# Patient Record
Sex: Female | Born: 1985 | Race: Black or African American | Hispanic: No | Marital: Single | Smoking: Current every day smoker
Health system: Southern US, Community
[De-identification: ages and names within clinical notes are randomized; demographics above are authoritative.]

---

## 2007-09-20 ENCOUNTER — Emergency Department: Payer: Self-pay | Admitting: Unknown Physician Specialty

## 2008-03-13 ENCOUNTER — Encounter: Payer: Self-pay | Admitting: Obstetrics and Gynecology

## 2008-06-06 ENCOUNTER — Observation Stay: Payer: Self-pay | Admitting: Obstetrics and Gynecology

## 2008-06-07 ENCOUNTER — Inpatient Hospital Stay: Payer: Self-pay

## 2008-10-16 ENCOUNTER — Emergency Department: Payer: Self-pay | Admitting: Emergency Medicine

## 2011-12-30 ENCOUNTER — Ambulatory Visit: Payer: Self-pay | Admitting: Oncology

## 2012-04-05 ENCOUNTER — Inpatient Hospital Stay: Payer: Self-pay

## 2012-04-06 LAB — CBC WITH DIFFERENTIAL/PLATELET
Basophil #: 0.2 10*3/uL — ABNORMAL HIGH (ref 0.0–0.1)
Basophil %: 2.5 %
Eosinophil %: 0.8 %
HCT: 35.8 % (ref 35.0–47.0)
MCH: 28.7 pg (ref 26.0–34.0)
MCHC: 33.9 g/dL (ref 32.0–36.0)
Monocyte #: 0.9 x10 3/mm (ref 0.2–0.9)
Neutrophil #: 6 10*3/uL (ref 1.4–6.5)
Platelet: 148 10*3/uL — ABNORMAL LOW (ref 150–440)
WBC: 8.6 10*3/uL (ref 3.6–11.0)

## 2012-04-07 LAB — HEMATOCRIT: HCT: 28.7 % — ABNORMAL LOW (ref 35.0–47.0)

## 2013-11-02 ENCOUNTER — Emergency Department: Payer: Self-pay | Admitting: Emergency Medicine

## 2014-01-24 ENCOUNTER — Emergency Department: Payer: Self-pay | Admitting: Emergency Medicine

## 2014-04-28 NOTE — Op Note (Signed)
PATIENT NAME:  Rita Ellis, Rita Ellis MR#:  [id number] DATE OF BIRTH:  1985-09-29  DATE OF PROCEDURE:  04/06/2012  NO DICTATION.  ____________________________ Suzy Bouchardhomas J. Schermerhorn, MD tjs:ct D: 04/06/2012 08:27:37 ET T: 04/06/2012 08:59:08 ET JOB#: [id number]  cc: Suzy Bouchardhomas J. Schermerhorn, MD, <Dictator> Suzy BouchardHOMAS J SCHERMERHORN MD ELECTRONICALLY SIGNED 04/12/2012 9:30

## 2014-04-28 NOTE — Op Note (Signed)
PATIENT NAME:  Rita Ellis, Rita Ellis MR#:  [id number] DATE OF BIRTH:  04-12-1985  DATE OF PROCEDURE:  04/06/2012  PREOPERATIVE DIAGNOSIS: Fetal bradycardia, 40 + [redacted] weeks gestation.   POSTOPERATIVE DIAGNOSIS: Fetal bradycardia, 40 + [redacted] weeks gestation.   PROCEDURE: Stat primary low transverse cesarean section.   SURGEON: Rita Ellis, Ellis.D.   FIRST ASSISTANT: Yetta BarreJones.   ANESTHESIA:  General endotracheal.  INDICATIONS: This is a 29 year old gravida 3, para 1 at 40 + 0 weeks. Labored throughout the night. Developed persistent fetal bradycardia to the 80s and 90s for 9 minutes. Stat C-section was called for.   DESCRIPTION OF PROCEDURE:  After the patient was prepped and draped in sterile fashion, general endotracheal anesthetic was administered.  A Pfannenstiel incision was made 2 fingerbreadths above the symphysis pubis. Sharp dissection was used to identify the fascia. The fascia was opened in the midline, peritoneal cavity was opened sharply and a low transverse uterine incision was made. Upon entry into the endometrial cavity minimal fluid noted. An extremely wedged fetal head was then encountered. Nursing hand from the vaginal route allowed for elevation of the head and delivery of the infant female. Dr. Beckie Saltsasnadi received the baby. The baby did have some spontaneous cries on the abdomen and Apgars were assigned as 8 and 8. The placenta was manually delivered. The uterus was exteriorized. The endometrial cavity was wiped clean. The patient at this time did receive 2 grams IV Ancef. This could not be administered prior to the start given the emergent nature of the procedure. The patient did receive 40 units of Pitocin in the IV bag as well. Uterine incision was closed with 1 chromic suture in a running locking fashion with good approximation of edges. Good hemostasis was noted. The fallopian tubes and ovaries appeared normal. The posterior cul-de-sac was irrigated and suctioned. The uterus was placed  back into the abdominal cavity and the uterine incision again appeared hemostatic. Interceed was placed over the lower uterine segment in a T-shaped fashion. The paracolic gutters were wiped clean with laparotomy tape. The fascia was then closed with 0 Vicryl suture in a running nonlocking fashion with good approximation of edges. Subcutaneous tissues were irrigated and bovied for hemostasis. The skin was reapproximated with staples. There were no complications. Cord gas pH 7.32, pCO2 47 and base excess -2.2.  The patient tolerated the procedure well and was taken to the recovery room in good condition.  ____________________________ Rita Bouchardhomas J. Margarie Mcguirt, MD tjs:sb D: 04/06/2012 08:32:02 ET T: 04/06/2012 09:08:56 ET JOB#: [id number]  cc: Rita Bouchardhomas J. Chastin Riesgo, MD, <Dictator> Rita BouchardHOMAS J Jefry Lesinski MD ELECTRONICALLY SIGNED 04/12/2012 9:31

## 2014-05-16 NOTE — H&P (Signed)
L&D Evaluation:  History:  HPI 29yo [id number] with LMP of 06/04/11 & [facility]  +Chlamydia tx and pt vomited so unsure if [facility] re-treated. Will check a Chlamydia. Pt presented this pm with UC's becoming more uncomfortable No ROM,VB or decreased FM. Cx is 4/80/post/vtx but pt is in active labor and will admit. GBS is unknown as no record of GBS on chart.   Presents with contractions   Patient's Medical History Ecclampsia with 1st pregnancy, child autistic, +GC at [facility] with last preg   Patient's Surgical History none   Medications Pre Natal Vitamins   Allergies NKDA   Social History tobacco  quit smoking early pregnancy   Family History Non-Contributory   ROS:  ROS All systems were reviewed.  HEENT, CNS, GI, GU, Respiratory, CV, Renal and Musculoskeletal systems were found to be normal.   Exam:  Vital Signs stable   Reflexes 1+   Mebranes Intact   FHT normal rate with no decels, +accels, no decels, Cat I   Ucx regular, q 3-4 mins, palp as mod   Skin dry   Lymph no lymphadenopathy   Impression:  Impression active labor   Plan:  Plan monitor contractions and for cervical change, antibiotics for GBBS prophylaxis, GBs unknown   Comments Pt is uncomfortable with UC's   Electronic Signatures: Sharee PimpleJones, Madoline Bhatt W (CNM)  (Signed 01-Apr-14 00:10)  Authored: L&D Evaluation   Last Updated: 01-Apr-14 00:10 by Sharee PimpleJones, Chanze Teagle W (CNM)

## 2016-02-17 ENCOUNTER — Emergency Department
Admission: EM | Admit: 2016-02-17 | Discharge: 2016-02-17 | Disposition: A | Discharge status: Home / Self Care | Payer: Medicaid Other | Attending: Emergency Medicine | Admitting: Emergency Medicine

## 2016-02-17 DIAGNOSIS — N390 Urinary tract infection, site not specified: Secondary | ICD-10-CM | POA: Diagnosis not present

## 2016-02-17 DIAGNOSIS — K0889 Other specified disorders of teeth and supporting structures: Secondary | ICD-10-CM | POA: Diagnosis present

## 2016-02-17 DIAGNOSIS — K029 Dental caries, unspecified: Secondary | ICD-10-CM | POA: Insufficient documentation

## 2016-02-17 DIAGNOSIS — N912 Amenorrhea, unspecified: Secondary | ICD-10-CM | POA: Insufficient documentation

## 2016-02-17 LAB — URINALYSIS, COMPLETE (UACMP) WITH MICROSCOPIC
Bilirubin Urine: NEGATIVE
GLUCOSE, UA: NEGATIVE mg/dL
KETONES UR: 5 mg/dL — AB
Nitrite: NEGATIVE
PH: 6 (ref 5.0–8.0)
Protein, ur: 30 mg/dL — AB
Specific Gravity, Urine: 1.024 (ref 1.005–1.030)

## 2016-02-17 LAB — POCT PREGNANCY, URINE: Preg Test, Ur: NEGATIVE

## 2016-02-17 MED ORDER — TRAMADOL HCL 50 MG PO TABS: 50.0000 mg | ORAL_TABLET | Freq: Four times a day (QID) | ORAL | 0 refills | Status: DC | PRN

## 2016-02-17 MED ORDER — CEPHALEXIN 500 MG PO CAPS
500.0000 mg | ORAL_CAPSULE | Freq: Once | ORAL | Status: AC
  Administered 2016-02-17: 500 mg via ORAL
  Filled 2016-02-17: qty 1

## 2016-02-17 MED ORDER — CEPHALEXIN 500 MG PO CAPS: 500.0000 mg | ORAL_CAPSULE | Freq: Four times a day (QID) | ORAL | 0 refills | Status: AC

## 2016-02-17 MED ORDER — LIDOCAINE VISCOUS 2 % MT SOLN
15.0000 mL | Freq: Once | OROMUCOSAL | Status: AC
  Administered 2016-02-17: 15 mL via OROMUCOSAL
  Filled 2016-02-17: qty 15

## 2016-02-17 MED ORDER — IBUPROFEN 800 MG PO TABS
800.0000 mg | ORAL_TABLET | Freq: Once | ORAL | Status: AC
  Administered 2016-02-17: 800 mg via ORAL
  Filled 2016-02-17: qty 1

## 2016-02-17 MED ORDER — IBUPROFEN 800 MG PO TABS: 800.0000 mg | ORAL_TABLET | Freq: Three times a day (TID) | ORAL | 0 refills | Status: DC | PRN

## 2016-02-17 NOTE — ED Notes (Signed)
NAD noted at time of D/C. Pt denies questions or concerns. Pt ambulatory to the lobby at this time.  

## 2016-02-17 NOTE — ED Provider Notes (Signed)
[facility] Emergency Department Provider Note   ____________________________________________   First MD Initiated Contact with Patient 02/17/16 2204     (approximate)  I have reviewed the triage vital signs and the nursing notes.   HISTORY  Chief Complaint Dental Pain and Possible Pregnancy    HPI Rita Ellis is a 31 y.o. female patient complain of right upper jaw pain. Patient has a history of dental pain for 2 months. Patient also states she request she will a dental pregnancy test because she does not know the date of last menstrual period. Patient denies fever, flank pain, or vaginal discharge. Patient states he feel like something is moving in her stomach.  No past medical history on file.  There are no active problems to display for this patient.   No past surgical history on file.  Prior to Admission medications   Medication Sig Start Date End Date Taking? Authorizing Provider  cephALEXin (KEFLEX) 500 MG capsule Take 1 capsule (500 mg total) by mouth 4 (four) times daily. 02/17/16 02/27/16  Joni Reining, PA-C  ibuprofen (ADVIL,MOTRIN) 800 MG tablet Take 1 tablet (800 mg total) by mouth every 8 (eight) hours as needed for moderate pain. 02/17/16   Joni Reining, PA-C  traMADol (ULTRAM) 50 MG tablet Take 1 tablet (50 mg total) by mouth every 6 (six) hours as needed for moderate pain. 02/17/16   Joni Reining, PA-C    Allergies Patient has no known allergies.  No family history on file.  Social History Social History  Substance Use Topics  . Smoking status: Not on file  . Smokeless tobacco: Not on file  . Alcohol use Not on file    Review of Systems Constitutional: No fever/chills Eyes: No visual changes. ENT: No sore throat. Cardiovascular: Denies chest pain. Respiratory: Denies shortness of breath. Gastrointestinal: No abdominal pain.  No nausea, no vomiting.  No diarrhea.  No constipation. Genitourinary: Negative for  dysuria. Musculoskeletal: Negative for back pain. Skin: Negative for rash. Neurological: Negative for headaches, focal weakness or numbness.  .  ____________________________________________   PHYSICAL EXAM:  VITAL SIGNS: ED Triage Vitals  Enc Vitals Group     BP 02/17/16 2123 (!) 127/99     Pulse Rate 02/17/16 2123 80     Resp 02/17/16 2123 20     Temp 02/17/16 2123 98.9 F (37.2 C)     Temp Source 02/17/16 2123 Oral     SpO2 02/17/16 2123 100 %     Weight 02/17/16 2122 200 lb (90.7 kg)     Height 02/17/16 2122 5' (1.524 m)     Head Circumference --      Peak Flow --      Pain Score 02/17/16 2123 8     Pain Loc --      Pain Edu? --      Excl. in GC? --     Constitutional: Alert and oriented. Well appearing and in no acute distress. Eyes: Conjunctivae are normal. PERRL. EOMI. Head: Atraumatic. Nose: No congestion/rhinnorhea. Mouth/Throat: Mucous membranes are moist.  Oropharynx non-erythematous. Neck: No stridor. No cervical spine tenderness to palpation. Hematological/Lymphatic/Immunilogical: No cervical lymphadenopathy. Cardiovascular: Normal rate, regular rhythm. Grossly normal heart sounds.  Good peripheral circulation. Respiratory: Normal respiratory effort.  No retractions. Lungs CTAB. Gastrointestinal: Soft and nontender. No distention. No abdominal bruits. No CVA tenderness. Musculoskeletal: No lower extremity tenderness nor edema.  No joint effusions. Neurologic:  Normal speech and language. No gross focal neurologic  deficits are appreciated. No gait instability. Skin:  Skin is warm, dry and intact. No rash noted. Psychiatric: Mood and affect are normal. Speech and behavior are normal.  ____________________________________________   LABS (all labs ordered are listed, but only abnormal results are displayed)  Labs Reviewed  URINALYSIS, COMPLETE (UACMP) WITH MICROSCOPIC - Abnormal; Notable for the following:       Result Value   Color, Urine YELLOW (*)     APPearance CLOUDY (*)    Hgb urine dipstick MODERATE (*)    Ketones, ur 5 (*)    Protein, ur 30 (*)    Leukocytes, UA SMALL (*)    Bacteria, UA MANY (*)    Squamous Epithelial / LPF 0-5 (*)    All other components within normal limits  POC URINE PREG, ED   ____________________________________________  EKG   ____________________________________________  RADIOLOGY   ____________________________________________   PROCEDURES  Procedure(s) performed: None  Procedures  Critical Care performed: No  ____________________________________________   INITIAL IMPRESSION / ASSESSMENT AND PLAN / ED COURSE  Pertinent labs & imaging results that were available during my care of the patient were reviewed by me and considered in my medical decision making (see chart for details).  Urinary tract infection and dental pain. Patient given discharge care instructions. Patient given nystatin to close follow-up care. Patient started on Keflex, tramadol, ibuprofen, and viscous lidocaine.      ____________________________________________   FINAL CLINICAL IMPRESSION(S) / ED DIAGNOSES  Final diagnoses:  Pain due to dental caries  Amenorrhea  Urinary tract infection without hematuria, site unspecified      NEW MEDICATIONS STARTED DURING THIS VISIT:  New Prescriptions   CEPHALEXIN (KEFLEX) 500 MG CAPSULE    Take 1 capsule (500 mg total) by mouth 4 (four) times daily.   IBUPROFEN (ADVIL,MOTRIN) 800 MG TABLET    Take 1 tablet (800 mg total) by mouth every 8 (eight) hours as needed for moderate pain.   TRAMADOL (ULTRAM) 50 MG TABLET    Take 1 tablet (50 mg total) by mouth every 6 (six) hours as needed for moderate pain.     Note:  This document was prepared using Dragon voice recognition software and may include unintentional dictation errors.    Joni ReiningRonald K Smith, PA-C 02/17/16 [id number]    Emily FilbertJonathan E Williams, MD 02/17/16 [phone]

## 2016-02-17 NOTE — Discharge Instructions (Signed)
OPTIONS FOR DENTAL FOLLOW UP CARE ° °[place] Department of Health and Human Services - Local Safety Net Dental Clinics °http://www.ncdhhs.gov/dph/oralhealth/services/safetynetclinics.htm °  °[facility] (336-562-3123) ° °[facility] [place] ([phone]) ° °[facility] ([phone] ext [phone]) ° °[facility] ([phone]) ° °[facility] ([phone]) °This clinic caters to the indigent population and is on a lottery system. °Location: °[facility], [address], [place] °Clinic Hours: °Wednesdays from 6pm - 9pm, patients seen by a lottery system. °For dates, call or go to www.med.[facility].edu/[facility]/patients/Dental-[facility] °Services: °Cleanings, fillings and simple extractions. °Payment Options: °DENTAL WORK IS FREE OF CHARGE. Bring proof of income or support. °Best way to get seen: °Arrive at 5:15 pm - this is a lottery, NOT first come/first serve, so arriving earlier will not increase your chances of being seen. °  °  °[facility] °[phone] °Select option 1 for emergencies °  °Location: °[facility], [address], [place] °Clinic Hours: °No walk-ins accepted - call the day before to schedule an appointment. °Check in times are 9:30 am and 1:30 pm. °Services: °Simple extractions, temporary fillings, pulpectomy/pulp debridement, uncomplicated abscess drainage. °Payment Options: °PAYMENT IS DUE AT THE TIME OF SERVICE.  Fee is usually $100-200, additional surgical procedures (e.g. abscess drainage) may be extra. °Cash, checks, Visa/MasterCard accepted.  Can file Medicaid if patient is covered for dental - patient should call case worker to check. °No discount for [facility] patients. °Best way to get seen: °MUST call the day before and get onto the schedule. Can usually be seen the next 1-2 days. No walk-ins accepted. °  °  °[facility] °[phone] °   °Location: °[facility], [address], [place] °Clinic Hours: °M, W, Th, F 8am or 1:30pm, Tues 9a or 1:30 - first come/first served. °Services: °Simple extractions, temporary fillings, uncomplicated abscess drainage.  You do not need to be an [place] resident. °Payment Options: °PAYMENT IS DUE AT THE TIME OF SERVICE. °Dental insurance, otherwise sliding scale - bring proof of income or support. °Depending on income and treatment needed, cost is usually $50-200. °Best way to get seen: °Arrive early as it is first come/first served. °  °  °[facility] °[phone] °  °Location: °[address] °Clinic Hours: °Mon-Thu 8a-5p °Services: °Most basic dental services including extractions and fillings. °Payment Options: °PAYMENT IS DUE AT THE TIME OF SERVICE. °Sliding scale, up to 50% off - bring proof if income or support. °Medicaid with dental option accepted. °Best way to get seen: °Call to schedule an appointment, can usually be seen within 2 weeks OR they will try to see walk-ins - show up at 8a or 2p (you may have to wait). °  °  °[facility] °919-245-2435 °[place] RESIDENTS ONLY °  °Location: °[facility], [address], [place], [place] [address] °Clinic Hours: By appointment only. °Monday - Thursday 8am-5pm, Friday 8am-12pm °Services: Cleanings, fillings, extractions. °Payment Options: °PAYMENT IS DUE AT THE TIME OF SERVICE. °Cash, Visa or MasterCard. Sliding scale - $30 minimum per service. °Best way to get seen: °Come in to office, complete packet and make an appointment - need proof of income °or support monies for each household member and proof of [place] residence. °Usually takes about a month to get in. °  °  °[facility] °[phone] °  °Location: °[address]   [place] °Clinic Hours: Walk-in Urgent Care Dental Services are offered Monday-Friday  mornings only. °The numbers of emergencies accepted daily is limited to the number of °providers available. °Maximum 15 - Mondays, Wednesdays & Thursdays °Maximum 10 - Tuesdays & Fridays °Services: °You do not need to be a [place] resident to be seen for a dental emergency. °Emergencies are defined as pain, swelling, abnormal bleeding, or dental trauma. Walkins will receive x-rays if needed. °NOTE: Dental cleaning is not an emergency. °Payment Options: °PAYMENT IS DUE AT THE TIME OF SERVICE. °Minimum co-pay is $40.00 for uninsured patients. °Minimum co-pay is $3.00 for Medicaid with dental coverage. °Dental Insurance is accepted and must be presented at time of visit. °Medicare does not cover dental. °Forms of payment: Cash, credit card, checks. °Best way to get seen: °If not previously registered with the clinic, walk-in dental registration begins at 7:15 am and is on a first come/first serve basis. °If previously registered with the clinic, call to make an appointment. °  °  °[facility] °[phone] °[place] RESIDENTS ONLY °  °Location: °[address], [place], [place] °Clinic Hours: °Mon-Thu 10a-2p °Services: Extractions only! °Payment Options: °FREE (donations accepted) - bring proof of income or support °Best way to get seen: °Call and schedule an appointment OR come at 8am on the 1st Monday of every month (except for holidays) when it is first come/first served. °  °  °[facility] °[phone] °  °Location: °[address], [place] °Clinic Hours: °Friday mornings °Services, Payment Options, Best way to get seen: °Call for info °

## 2016-02-17 NOTE — ED Notes (Signed)
Pt c/o R sided dental pain x 2 months. Pt also requests a pregnancy test at this time. NAD noted, respirations even and unlabored.

## 2016-02-17 NOTE — ED Triage Notes (Signed)
Patient reports having left upper jaw pain.  Patient also states "Can I have a pregnancy test.  I keep feeling something move in my stomach and I don't know when my last period was."  Patient does not states having abdominal pain.

## 2017-04-26 ENCOUNTER — Other Ambulatory Visit: Payer: Self-pay

## 2017-04-26 ENCOUNTER — Emergency Department
Admission: EM | Admit: 2017-04-26 | Discharge: 2017-04-26 | Disposition: A | Discharge status: Home / Self Care | Payer: Medicaid Other | Attending: Emergency Medicine | Admitting: Emergency Medicine

## 2017-04-26 ENCOUNTER — Encounter: Payer: Self-pay | Admitting: Emergency Medicine

## 2017-04-26 DIAGNOSIS — N3090 Cystitis, unspecified without hematuria: Secondary | ICD-10-CM

## 2017-04-26 DIAGNOSIS — N3000 Acute cystitis without hematuria: Secondary | ICD-10-CM | POA: Diagnosis not present

## 2017-04-26 DIAGNOSIS — R300 Dysuria: Secondary | ICD-10-CM | POA: Diagnosis present

## 2017-04-26 LAB — URINALYSIS, COMPLETE (UACMP) WITH MICROSCOPIC
Bilirubin Urine: NEGATIVE
GLUCOSE, UA: NEGATIVE mg/dL
Ketones, ur: 5 mg/dL — AB
Nitrite: POSITIVE — AB
PH: 6 (ref 5.0–8.0)
Protein, ur: 100 mg/dL — AB
SPECIFIC GRAVITY, URINE: 1.027 (ref 1.005–1.030)

## 2017-04-26 MED ORDER — CEFTRIAXONE SODIUM 1 G IJ SOLR
1.0000 g | Freq: Once | INTRAMUSCULAR | Status: AC
  Administered 2017-04-26: 1 g via INTRAMUSCULAR
  Filled 2017-04-26: qty 10

## 2017-04-26 MED ORDER — AZITHROMYCIN 500 MG PO TABS
1000.0000 mg | ORAL_TABLET | Freq: Once | ORAL | Status: AC
  Administered 2017-04-26: 1000 mg via ORAL
  Filled 2017-04-26: qty 2

## 2017-04-26 MED ORDER — CEPHALEXIN 500 MG PO CAPS: 500.0000 mg | ORAL_CAPSULE | Freq: Three times a day (TID) | ORAL | 0 refills | Status: DC

## 2017-04-26 MED ORDER — METRONIDAZOLE 500 MG PO TABS
2000.0000 mg | ORAL_TABLET | Freq: Once | ORAL | Status: AC
  Administered 2017-04-26: 2000 mg via ORAL
  Filled 2017-04-26: qty 4

## 2017-04-26 MED ORDER — CEPHALEXIN 250 MG/5ML PO SUSR: 500.0000 mg | Freq: Three times a day (TID) | ORAL | 0 refills | Status: AC

## 2017-04-26 NOTE — ED Triage Notes (Signed)
Pt c/o burning and frequency, states was dx with UTI but never took abx due to losing them.

## 2017-04-26 NOTE — ED Notes (Signed)
Patient to ED for bladder pain. States she had the onset of pain with urination two days ago. States she has felt chilled as well though has not checked her temperature. Patient is alert and oriented and able to answer questions; states "I just don't feel good." Patient states painful urination as well as urgency. Present with 32 year old daughter who is busy in the room and able to answer questions for her mother as well.

## 2017-04-26 NOTE — ED Provider Notes (Signed)
[facility] Emergency Department Provider Note  ____________________________________________  Time seen: Approximately 6:02 PM  I have reviewed the triage vital signs and the nursing notes.   HISTORY  Chief Complaint Dysuria    HPI Rita Ellis is a 32 y.o. female presents to the emergency department with dysuria and increased urinary frequency for approximately 1 year.  Patient reports that she presented to her primary care office at some point last year and was prescribed antibiotic but lost prescription.  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  Patient does not want a "pelvic exam".  Patient reports that she was recently tested for syphilis and tested negative but has not undergone recent STD testing.  She denies nausea, vomiting or abdominal pain.  No pelvic pain.  She reports no fever or chills.   History reviewed. No pertinent past medical history.  There are no active problems to display for this patient.   Past Surgical History:  Procedure Laterality Date  . CESAREAN SECTION      Prior to Admission medications   Medication Sig Start Date End Date Taking? Authorizing Provider  cephALEXin (KEFLEX) 250 MG/5ML suspension Take 10 mLs (500 mg total) by mouth 3 (three) times daily for 10 days. 04/26/17 05/06/17  Orvil Feil, PA-C  ibuprofen (ADVIL,MOTRIN) 800 MG tablet Take 1 tablet (800 mg total) by mouth every 8 (eight) hours as needed for moderate pain. 02/17/16   Joni Reining, PA-C  traMADol (ULTRAM) 50 MG tablet Take 1 tablet (50 mg total) by mouth every 6 (six) hours as needed for moderate pain. 02/17/16   Joni Reining, PA-C    Allergies Patient has no known allergies.  No family history on file.  Social History Social History   Tobacco Use  . Smoking status: Never Smoker  . Smokeless tobacco: Never Used  Substance Use Topics  . Alcohol use: Not Currently    Frequency: Never  . Drug use: Never      Review of Systems  Constitutional: No fever/chills Eyes: No visual changes. No discharge ENT: No upper respiratory complaints. Cardiovascular: no chest pain. Respiratory: no cough. No SOB. Gastrointestinal: No abdominal pain.  No nausea, no vomiting.  No diarrhea.  No constipation. Genitourinary: Patient has dysuria and increased urinary frequency.  Musculoskeletal: Negative for musculoskeletal pain. Skin: Negative for rash, abrasions, lacerations, ecchymosis. Neurological: Negative for headaches, focal weakness or numbness.  ____________________________________________   PHYSICAL EXAM:  VITAL SIGNS: ED Triage Vitals [04/26/17 1510]  Enc Vitals Group     BP 129/80     Pulse Rate 83     Resp 18     Temp 98.9 F (37.2 C)     Temp Source Oral     SpO2 100 %     Weight 200 lb (90.7 kg)     Height 5\' 2"  (1.575 m)     Head Circumference      Peak Flow      Pain Score 8     Pain Loc      Pain Edu?      Excl. in GC?      Constitutional: Alert and oriented. Well appearing and in no acute distress. Eyes: Conjunctivae are normal. PERRL. EOMI. Head: Atraumatic. Cardiovascular: Normal rate, regular rhythm. Normal S1 and S2.  Good peripheral circulation. Respiratory: Normal respiratory effort without tachypnea or retractions. Lungs CTAB. Good air entry to the bases with no decreased or absent breath sounds. Gastrointestinal: Bowel  sounds 4 quadrants. Soft and nontender to palpation. No guarding or rigidity. No palpable masses. No distention. No CVA tenderness. Musculoskeletal: Full range of motion to all extremities. No gross deformities appreciated. Neurologic:  Normal speech and language. No gross focal neurologic deficits are appreciated.  Skin:  Skin is warm, dry and intact. No rash noted.  ____________________________________________   LABS (all labs ordered are listed, but only abnormal results are displayed)  Labs Reviewed  URINALYSIS, COMPLETE (UACMP) WITH  MICROSCOPIC - Abnormal; Notable for the following components:      Result Value   Color, Urine YELLOW (*)    APPearance CLOUDY (*)    Hgb urine dipstick LARGE (*)    Ketones, ur 5 (*)    Protein, ur 100 (*)    Nitrite POSITIVE (*)    Leukocytes, UA LARGE (*)    Bacteria, UA FEW (*)    Squamous Epithelial / LPF 0-5 (*)    All other components within normal limits   ____________________________________________  EKG   ____________________________________________  RADIOLOGY  No results found.  ____________________________________________    PROCEDURES  Procedure(s) performed:    Procedures    Medications  cefTRIAXone (ROCEPHIN) injection 1 g (1 g Intramuscular Given 04/26/17 1825)  azithromycin (ZITHROMAX) tablet 1,000 mg (1,000 mg Oral Given 04/26/17 1824)  metroNIDAZOLE (FLAGYL) tablet 2,000 mg (2,000 mg Oral Given 04/26/17 1824)     ____________________________________________   INITIAL IMPRESSION / ASSESSMENT AND PLAN / ED COURSE  Pertinent labs & imaging results that were available during my care of the patient were reviewed by me and considered in my medical decision making (see chart for details).  Review of the [place] CSRS was performed in accordance of the [facility] prior to dispensing any controlled drugs.     Assessment and Plan: Acute Cystitis:  Patient presents to the emergency department with dysuria and increased urinary frequency for a reported year duration.  Patient declined.  I offered to treat patient empirically for gonorrhea, chlamydia testing for gonorrhea, chlamydia and trichomoniasis despite my medical advice and trichomoniasis and she accepted treatment.  Patient received Rocephin, azithromycin and Flagyl in the emergency department.  She was discharged with Keflex and advised to follow-up with primary care for additional STD testing.  Patient voiced understanding.  All patient questions were  answered.     ____________________________________________  FINAL CLINICAL IMPRESSION(S) / ED DIAGNOSES  Final diagnoses:  Cystitis      NEW MEDICATIONS STARTED DURING THIS VISIT:  ED Discharge Orders        Ordered    cephALEXin (KEFLEX) 500 MG capsule  3 times daily,   Status:  Discontinued     04/26/17 1845    cephALEXin (KEFLEX) 250 MG/5ML suspension  3 times daily     04/26/17 1903          This chart was dictated using voice recognition software/Dragon. Despite best efforts to proofread, errors can occur which can change the meaning. Any change was purely unintentional.    Gasper LloydWoods, Jaclyn M, PA-C 04/26/17 [place] [id number]    Stafford, Phillip, MD 04/26/17 [phone]

## 2017-12-08 ENCOUNTER — Encounter: Payer: Self-pay | Admitting: Emergency Medicine

## 2017-12-08 ENCOUNTER — Other Ambulatory Visit: Payer: Self-pay

## 2017-12-08 ENCOUNTER — Emergency Department
Admission: EM | Admit: 2017-12-08 | Discharge: 2017-12-08 | Disposition: A | Discharge status: Home / Self Care | Payer: Medicaid Other | Attending: Emergency Medicine | Admitting: Emergency Medicine

## 2017-12-08 DIAGNOSIS — R1010 Upper abdominal pain, unspecified: Secondary | ICD-10-CM | POA: Diagnosis present

## 2017-12-08 DIAGNOSIS — K219 Gastro-esophageal reflux disease without esophagitis: Secondary | ICD-10-CM

## 2017-12-08 DIAGNOSIS — K2900 Acute gastritis without bleeding: Secondary | ICD-10-CM | POA: Diagnosis not present

## 2017-12-08 LAB — URINALYSIS, COMPLETE (UACMP) WITH MICROSCOPIC
Bilirubin Urine: NEGATIVE
GLUCOSE, UA: NEGATIVE mg/dL
HGB URINE DIPSTICK: NEGATIVE
Ketones, ur: NEGATIVE mg/dL
NITRITE: NEGATIVE
PH: 5 (ref 5.0–8.0)
PROTEIN: NEGATIVE mg/dL
SPECIFIC GRAVITY, URINE: 1.028 (ref 1.005–1.030)

## 2017-12-08 LAB — POC URINE PREG, ED: PREG TEST UR: NEGATIVE

## 2017-12-08 MED ORDER — FAMOTIDINE 20 MG PO TABS: 20.0000 mg | ORAL_TABLET | Freq: Two times a day (BID) | ORAL | 0 refills | Status: DC

## 2017-12-08 MED ORDER — ALUM & MAG HYDROXIDE-SIMETH 200-200-20 MG/5ML PO SUSP
30.0000 mL | Freq: Once | ORAL | Status: AC
  Administered 2017-12-08: 30 mL via ORAL
  Filled 2017-12-08: qty 30

## 2017-12-08 NOTE — ED Provider Notes (Signed)
[facility] Emergency Department Provider Note  ___________________________________________   First MD Initiated Contact with Patient 12/08/17 1935     (approximate)  I have reviewed the triage vital signs and the nursing notes.   HISTORY  Chief Complaint Abdominal Pain   HPI JAYCE BOYKO is a 32 y.o. female with 2 days of upper abdominal pain that she says worsens when she lays flat.  Says that she has had a history of reflux but only when she has been pregnant.  Is not tried any medication for the pain.  Is refusing blood work at this time because she does not want needles.  Also with recent watery stools 1 day ago but this is stopped as of today.  Patient able to tolerate food and ate a hotdog for lunch today which says slightly worsened the pain.  Denies any chest pain or shortness of breath.  Patient is denying any lower abdominal pain.   History reviewed. No pertinent past medical history.  There are no active problems to display for this patient.   Past Surgical History:  Procedure Laterality Date  . CESAREAN SECTION      Prior to Admission medications   Medication Sig Start Date End Date Taking? Authorizing Provider  ibuprofen (ADVIL,MOTRIN) 800 MG tablet Take 1 tablet (800 mg total) by mouth every 8 (eight) hours as needed for moderate pain. 02/17/16   Joni Reining, PA-C  traMADol (ULTRAM) 50 MG tablet Take 1 tablet (50 mg total) by mouth every 6 (six) hours as needed for moderate pain. 02/17/16   Joni Reining, PA-C    Allergies Patient has no known allergies.  No family history on file.  Social History Social History   Tobacco Use  . Smoking status: Never Smoker  . Smokeless tobacco: Never Used  Substance Use Topics  . Alcohol use: Not Currently    Frequency: Never  . Drug use: Never    Review of Systems  Constitutional: No fever/chills Eyes: No visual changes. ENT: No sore throat. Cardiovascular: Denies chest  pain. Respiratory: Denies shortness of breath. Gastrointestinal:  No constipation. Genitourinary: Negative for dysuria. Musculoskeletal: Negative for back pain. Skin: Negative for rash. Neurological: Negative for headaches, focal weakness or numbness.   ____________________________________________   PHYSICAL EXAM:  VITAL SIGNS: ED Triage Vitals  Enc Vitals Group     BP 12/08/17 1929 111/64     Pulse Rate 12/08/17 1929 64     Resp 12/08/17 1929 19     Temp 12/08/17 1929 98.4 F (36.9 C)     Temp Source 12/08/17 1929 Oral     SpO2 12/08/17 1929 100 %     Weight --      Height 12/08/17 1929 5' (1.524 m)     Head Circumference --      Peak Flow --      Pain Score 12/08/17 1933 6     Pain Loc --      Pain Edu? --      Excl. in GC? --     Constitutional: Alert and oriented. Well appearing and in no acute distress. Eyes: Conjunctivae are normal.  Head: Atraumatic. Nose: No congestion/rhinnorhea. Mouth/Throat: Mucous membranes are moist.  Neck: No stridor.   Cardiovascular: Normal rate, regular rhythm. Grossly normal heart sounds.   Respiratory: Normal respiratory effort.  No retractions. Lungs CTAB. Gastrointestinal: Soft with minimal epigastric tenderness to palpation.  No rebound or guarding. No distention. No CVA tenderness. Musculoskeletal: No lower  extremity tenderness nor edema.  No joint effusions. Neurologic:  Normal speech and language. No gross focal neurologic deficits are appreciated. Skin:  Skin is warm, dry and intact. No rash noted. Psychiatric: Mood and affect are normal. Speech and behavior are normal.  ____________________________________________   LABS (all labs ordered are listed, but only abnormal results are displayed)  Labs Reviewed  URINALYSIS, COMPLETE (UACMP) WITH MICROSCOPIC - Abnormal; Notable for the following components:      Result Value   Color, Urine YELLOW (*)    APPearance HAZY (*)    Leukocytes, UA TRACE (*)    Bacteria, UA RARE  (*)    All other components within normal limits  URINE CULTURE  POC URINE PREG, ED   ____________________________________________  EKG  ED ECG REPORT I, Arelia Longestavid M Schaevitz, the attending physician, personally viewed and interpreted this ECG.   Date: 12/08/2017  EKG Time: 2041  Rate: 60  Rhythm: normal sinus rhythm  Axis: Normal  Intervals:none  ST&T Change: No ST segment elevation or depression.  No abnormal T wave inversion.  ____________________________________________  RADIOLOGY   ____________________________________________   PROCEDURES  Procedure(s) performed:   Procedures  Critical Care performed:   ____________________________________________   INITIAL IMPRESSION / ASSESSMENT AND PLAN / ED COURSE  Pertinent labs & imaging results that were available during my care of the patient were reviewed by me and considered in my medical decision making (see chart for details).  Differential diagnosis includes, but is not limited to, biliary disease (biliary colic, acute cholecystitis, cholangitis, choledocholithiasis, etc), intrathoracic causes for epigastric abdominal pain including ACS, gastritis, duodenitis, pancreatitis, small bowel or large bowel obstruction, abdominal aortic aneurysm, hernia, and ulcer(s). As part of my medical decision making, I reviewed the following data within the electronic MEDICAL RECORD NUMBER Notes from prior ED visits  ----------------------------------------- 10:13 PM on 12/08/2017 -----------------------------------------  Patient with pain gone after Maalox.  Pregnancy is negative.  Urine contaminated and without urinary symptoms.  Will send for culture but I do not believe the patient to have a urinary tract infection.  Patient will be discharged with Pepcid for what appears to be gastritis/reflux.  She will follow-up in the office with primary care.  She is understanding of the diagnosis as well as treatment and willing to  comply. ____________________________________________   FINAL CLINICAL IMPRESSION(S) / ED DIAGNOSES  Gastritis GERD    NEW MEDICATIONS STARTED DURING THIS VISIT:  New Prescriptions   No medications on file     Note:  This document was prepared using Dragon voice recognition software and may include unintentional dictation errors.     Myrna BlazerSchaevitz, David Matthew, MD 12/08/17 2214

## 2017-12-08 NOTE — ED Notes (Signed)
ED Provider at bedside. 

## 2017-12-08 NOTE — ED Triage Notes (Signed)
Patient ambulatory to triage with steady gait, without difficulty or distress noted; pt reports mid upper abd pain x 2 days; st "I thought I was just hungry"; recent watery stool, V x 1

## 2017-12-08 NOTE — ED Notes (Signed)
Pt refusing blood work at this time. Pt requesting to talk to doctor before testing begins.

## 2017-12-10 LAB — URINE CULTURE

## 2018-10-04 ENCOUNTER — Encounter: Payer: Self-pay | Admitting: *Deleted

## 2018-10-04 ENCOUNTER — Emergency Department: Payer: Medicaid Other

## 2018-10-04 ENCOUNTER — Emergency Department
Admission: EM | Admit: 2018-10-04 | Discharge: 2018-10-04 | Disposition: A | Discharge status: Home / Self Care | Payer: Medicaid Other | Attending: Emergency Medicine | Admitting: Emergency Medicine

## 2018-10-04 ENCOUNTER — Other Ambulatory Visit: Payer: Self-pay

## 2018-10-04 DIAGNOSIS — J020 Streptococcal pharyngitis: Secondary | ICD-10-CM | POA: Diagnosis not present

## 2018-10-04 DIAGNOSIS — J029 Acute pharyngitis, unspecified: Secondary | ICD-10-CM | POA: Diagnosis present

## 2018-10-04 LAB — CBC
HCT: 41.7 % (ref 36.0–46.0)
Hemoglobin: 13.8 g/dL (ref 12.0–15.0)
MCH: 27.9 pg (ref 26.0–34.0)
MCHC: 33.1 g/dL (ref 30.0–36.0)
MCV: 84.2 fL (ref 80.0–100.0)
Platelets: 203 10*3/uL (ref 150–400)
RBC: 4.95 MIL/uL (ref 3.87–5.11)
RDW: 12.8 % (ref 11.5–15.5)
WBC: 10.4 10*3/uL (ref 4.0–10.5)
nRBC: 0 % (ref 0.0–0.2)

## 2018-10-04 LAB — PREGNANCY, URINE: Preg Test, Ur: NEGATIVE

## 2018-10-04 LAB — BASIC METABOLIC PANEL
Anion gap: 12 (ref 5–15)
BUN: 10 mg/dL (ref 6–20)
CO2: 23 mmol/L (ref 22–32)
Calcium: 9.3 mg/dL (ref 8.9–10.3)
Chloride: 97 mmol/L — ABNORMAL LOW (ref 98–111)
Creatinine, Ser: 0.93 mg/dL (ref 0.44–1.00)
GFR calc Af Amer: >60 mL/min (ref >60)
GFR calc non Af Amer: >60 mL/min (ref >60)
Glucose, Bld: 125 mg/dL — ABNORMAL HIGH (ref 70–99)
Potassium: 3.4 mmol/L — ABNORMAL LOW (ref 3.5–5.1)
Sodium: 132 mmol/L — ABNORMAL LOW (ref 135–145)

## 2018-10-04 LAB — GROUP A STREP BY PCR: Group A Strep by PCR: DETECTED — AB

## 2018-10-04 LAB — TROPONIN I (HIGH SENSITIVITY): Troponin I (High Sensitivity): 3 ng/L (ref <18)

## 2018-10-04 MED ORDER — ACETAMINOPHEN 160 MG/5ML PO SOLN
15.0000 mg/kg | Freq: Once | ORAL | Status: AC
  Administered 2018-10-04: 1000 mg via ORAL

## 2018-10-04 MED ORDER — ACETAMINOPHEN 160 MG/5ML PO SUSP
ORAL | Status: AC
  Administered 2018-10-04: 20:00:00
  Filled 2018-10-04: qty 5

## 2018-10-04 MED ORDER — DEXAMETHASONE SODIUM PHOSPHATE 10 MG/ML IJ SOLN
10.0000 mg | Freq: Once | INTRAMUSCULAR | Status: AC
  Administered 2018-10-04: 23:00:00 10 mg via INTRAMUSCULAR
  Filled 2018-10-04: qty 1

## 2018-10-04 MED ORDER — SODIUM CHLORIDE 0.9% FLUSH: 3.0000 mL | Freq: Once | INTRAVENOUS | Status: DC

## 2018-10-04 MED ORDER — PENICILLIN V POTASSIUM 500 MG PO TABS: 500.0000 mg | ORAL_TABLET | Freq: Three times a day (TID) | ORAL | 0 refills | Status: AC

## 2018-10-04 MED ORDER — ACETAMINOPHEN 160 MG/5ML PO SUSP
ORAL | Status: AC
  Filled 2018-10-04: qty 5

## 2018-10-04 NOTE — ED Notes (Signed)
Signature pad not working properly. This RN explained pt DC documents; follow up appointments and Rx. Pt verbalizes understanding of DC documents. Pt able to speak clear at this time asking questions regarding appointment; this RN address all questions.

## 2018-10-04 NOTE — ED Notes (Signed)
This Rn contacted lab for blood draw. Per lab tech pt refused tourniqued to be placed because "it's too tight" Lab tech unable to draw blood. This RN  Will attempt to obtain help.

## 2018-10-04 NOTE — ED Notes (Signed)
Pt nonverbal during this RN attempting to assess pt. This RN asked if it is because she has a sore throath that she is unable to answer question to this RN, pt nods head st yes.

## 2018-10-04 NOTE — Discharge Instructions (Addendum)
Please seek medical attention for any high fevers, chest pain, shortness of breath, change in behavior, persistent vomiting, bloody stool or any other new or concerning symptoms.  

## 2018-10-04 NOTE — ED Triage Notes (Signed)
t to triage via wheelchair.  Pt has fever, sore throat, back pain and sob/cough.  Sx x 2 days.   Pt alert

## 2018-10-04 NOTE — ED Notes (Signed)
This Rn spoke with Rita Ellis to provide an update.

## 2018-10-04 NOTE — ED Notes (Signed)
[facility] EDT will attempt to collect blood sample.

## 2018-10-04 NOTE — ED Provider Notes (Signed)
[facility] Emergency Department Provider Note   ____________________________________________   I have reviewed the triage vital signs and the nursing notes.   HISTORY  Chief Complaint Sore thoat  History limited by: Not Limited   HPI Rita Ellis is a 33 y.o. female who presents to the emergency department today because of concerns for sore throat.  She states it started 2 days ago.  Located on the left side of the throat.  She states it severe.  It is worse when she tries to swallow.  She does feel like she is having difficulty with swallowing and passing food down her throat.  The patient denies any known sick contacts.  The patient is also complaining of some shortness of breath.  She states this is been present for months.  She denies having seen anyone for.  She is also complaining of back pain. She did not sense that she had a fever.  Records reviewed. Per medical record review patient has a history of ER visit for cystitis and gastritis  No past medical history on file.  There are no active problems to display for this patient.   Past Surgical History:  Procedure Laterality Date  . CESAREAN SECTION      Prior to Admission medications   Medication Sig Start Date End Date Taking? Authorizing Provider  famotidine (PEPCID) 20 MG tablet Take 1 tablet (20 mg total) by mouth 2 (two) times daily. 12/08/17 12/08/18  Myrna Blazer, MD  ibuprofen (ADVIL,MOTRIN) 800 MG tablet Take 1 tablet (800 mg total) by mouth every 8 (eight) hours as needed for moderate pain. 02/17/16   Joni Reining, PA-C  traMADol (ULTRAM) 50 MG tablet Take 1 tablet (50 mg total) by mouth every 6 (six) hours as needed for moderate pain. 02/17/16   Joni Reining, PA-C    Allergies Patient has no known allergies.  No family history on file.  Social History Social History   Tobacco Use  . Smoking status: Never Smoker  . Smokeless tobacco: Never Used  Substance Use  Topics  . Alcohol use: Not Currently    Frequency: Never  . Drug use: Never    Review of Systems Constitutional: No fever/chills Eyes: No visual changes. ENT: Positive for sore throat.  Cardiovascular: Denies chest pain. Respiratory: Positive for shortness of breath. Gastrointestinal: No abdominal pain.  No nausea, no vomiting.  No diarrhea.   Genitourinary: Negative for dysuria. Musculoskeletal: Positive for back pain. Skin: Negative for rash. Neurological: Negative for headaches, focal weakness or numbness.  ____________________________________________   PHYSICAL EXAM:  VITAL SIGNS: ED Triage Vitals  Enc Vitals Group     BP 10/04/18 1809 (!) 144/90     Pulse Rate 10/04/18 1809 (!) 124     Resp 10/04/18 1809 (!) 22     Temp 10/04/18 1809 (!) 103.2 F (39.6 C)     Temp Source 10/04/18 1809 Oral     SpO2 10/04/18 1809 99 %     Weight --      Height --      Head Circumference --      Peak Flow --      Pain Score 10/04/18 1810 8   Constitutional: Alert and oriented.  Eyes: Conjunctivae are normal.  ENT      Head: Normocephalic and atraumatic.      Nose: No congestion/rhinnorhea.      Mouth/Throat: Mucous membranes are moist. Unable to visualize tonsils or uvula.  Neck: No stridor. Tenderness to palpation of the left neck.  Hematological/Lymphatic/Immunilogical: No cervical lymphadenopathy. Cardiovascular: Normal rate, regular rhythm.  No murmurs, rubs, or gallops. Respiratory: Normal respiratory effort without tachypnea nor retractions. Breath sounds are clear and equal bilaterally. No wheezes/rales/rhonchi. Gastrointestinal: Soft and non tender. No rebound. No guarding.  Genitourinary: Deferred Musculoskeletal: Normal range of motion in all extremities. No lower extremity edema. Neurologic:  Normal speech and language. No gross focal neurologic deficits are appreciated.  Skin:  Skin is warm, dry and intact. No rash noted. Psychiatric: Mood and affect are  normal. Speech and behavior are normal. Patient exhibits appropriate insight and judgment.  ____________________________________________    LABS (pertinent positives/negatives)  CBC wbc 10.4, hgb 13.8, plt 203 Upreg neg Strep detected Troponin hs 3 BMP na 132, k 3.4, glu 125, cr 0.93 ____________________________________________   EKG  I, Nance Pear, attending physician, personally viewed and interpreted this EKG  EKG Time: 1814 Rate: 124 Rhythm: sinus tachycardia Axis: left axis deviation Intervals: qtc 410 QRS: narrow ST changes: no st elevation Impression: abnormal ekg  ____________________________________________    RADIOLOGY  CXR No acute abnormality  ____________________________________________   PROCEDURES  Procedures  ____________________________________________   INITIAL IMPRESSION / ASSESSMENT AND PLAN / ED COURSE  Pertinent labs & imaging results that were available during my care of the patient were reviewed by me and considered in my medical decision making (see chart for details).   Patient presented to the emergency department today because of concerns for sore throat.  Patient stated significant pain with some difficulty with swallowing.  On exam I cannot visualize the tonsils or the uvula.  Strep was detected.  I discussed with the patient that I cannot rule out deep tissue infection given that I could not fully evaluate her posterior pharynx.  I did recommend CT scan to further evaluate however patient declined.  Discussed risk of deep tissue infection including death.  Will start patient on antibiotics.  ____________________________________________   FINAL CLINICAL IMPRESSION(S) / ED DIAGNOSES  Final diagnoses:  Strep throat     Note: This dictation was prepared with Dragon dictation. Any transcriptional errors that result from this process are unintentional     Nance Pear, MD 10/04/18 [phone]

## 2018-10-04 NOTE — ED Notes (Signed)
Lab called to obtain blood

## 2018-10-04 NOTE — ED Notes (Signed)
ED Provider Goodman at bedside. 

## 2018-10-04 NOTE — ED Notes (Signed)
This Rn spoke with Hughie Closs to provide a pt status update.

## 2018-12-10 ENCOUNTER — Other Ambulatory Visit: Payer: Self-pay

## 2018-12-10 ENCOUNTER — Emergency Department
Admission: EM | Admit: 2018-12-10 | Discharge: 2018-12-11 | Disposition: A | Discharge status: Home / Self Care | Payer: Medicaid Other | Attending: Emergency Medicine | Admitting: Emergency Medicine

## 2018-12-10 DIAGNOSIS — F209 Schizophrenia, unspecified: Secondary | ICD-10-CM

## 2018-12-10 DIAGNOSIS — Z046 Encounter for general psychiatric examination, requested by authority: Secondary | ICD-10-CM | POA: Insufficient documentation

## 2018-12-10 DIAGNOSIS — Z20828 Contact with and (suspected) exposure to other viral communicable diseases: Secondary | ICD-10-CM | POA: Insufficient documentation

## 2018-12-10 DIAGNOSIS — F23 Brief psychotic disorder: Secondary | ICD-10-CM | POA: Diagnosis not present

## 2018-12-10 DIAGNOSIS — F259 Schizoaffective disorder, unspecified: Secondary | ICD-10-CM | POA: Insufficient documentation

## 2018-12-10 LAB — COMPREHENSIVE METABOLIC PANEL
ALT: 28 U/L (ref 0–44)
AST: 28 U/L (ref 15–41)
Albumin: 4.7 g/dL (ref 3.5–5.0)
Alkaline Phosphatase: 41 U/L (ref 38–126)
Anion gap: 12 (ref 5–15)
BUN: 10 mg/dL (ref 6–20)
CO2: 26 mmol/L (ref 22–32)
Calcium: 9.3 mg/dL (ref 8.9–10.3)
Chloride: 99 mmol/L (ref 98–111)
Creatinine, Ser: 0.91 mg/dL (ref 0.44–1.00)
GFR calc Af Amer: >60 mL/min (ref >60)
GFR calc non Af Amer: >60 mL/min (ref >60)
Glucose, Bld: 130 mg/dL — ABNORMAL HIGH (ref 70–99)
Potassium: 3.2 mmol/L — ABNORMAL LOW (ref 3.5–5.1)
Sodium: 137 mmol/L (ref 135–145)
Total Bilirubin: 0.6 mg/dL (ref 0.3–1.2)
Total Protein: 8.5 g/dL — ABNORMAL HIGH (ref 6.5–8.1)

## 2018-12-10 LAB — CBC
HCT: 37.7 % (ref 36.0–46.0)
Hemoglobin: 12.9 g/dL (ref 12.0–15.0)
MCH: 28 pg (ref 26.0–34.0)
MCHC: 34.2 g/dL (ref 30.0–36.0)
MCV: 81.8 fL (ref 80.0–100.0)
Platelets: 242 10*3/uL (ref 150–400)
RBC: 4.61 MIL/uL (ref 3.87–5.11)
RDW: 12.8 % (ref 11.5–15.5)
WBC: 6.9 10*3/uL (ref 4.0–10.5)
nRBC: 0 % (ref 0.0–0.2)

## 2018-12-10 LAB — SALICYLATE LEVEL: Salicylate Lvl: <7 mg/dL (ref 2.8–30.0)

## 2018-12-10 LAB — URINE DRUG SCREEN, QUALITATIVE (ARMC ONLY)
Amphetamines, Ur Screen: NOT DETECTED
Barbiturates, Ur Screen: NOT DETECTED
Benzodiazepine, Ur Scrn: NOT DETECTED
Cannabinoid 50 Ng, Ur ~~LOC~~: NOT DETECTED
Cocaine Metabolite,Ur ~~LOC~~: NOT DETECTED
MDMA (Ecstasy)Ur Screen: NOT DETECTED
Methadone Scn, Ur: NOT DETECTED
Opiate, Ur Screen: NOT DETECTED
Phencyclidine (PCP) Ur S: NOT DETECTED
Tricyclic, Ur Screen: NOT DETECTED

## 2018-12-10 LAB — PREGNANCY, URINE: Preg Test, Ur: NEGATIVE

## 2018-12-10 LAB — ETHANOL: Alcohol, Ethyl (B): <10 mg/dL (ref <10)

## 2018-12-10 LAB — ACETAMINOPHEN LEVEL: Acetaminophen (Tylenol), Serum: <10 ug/mL — ABNORMAL LOW (ref 10–30)

## 2018-12-10 MED ORDER — POTASSIUM CHLORIDE CRYS ER 20 MEQ PO TBCR: 40.0000 meq | EXTENDED_RELEASE_TABLET | Freq: Once | ORAL | Status: DC

## 2018-12-10 NOTE — ED Notes (Signed)
Waiting for psych to see pt before swabbing. Psych aware of pt and going to try to come see pt.

## 2018-12-10 NOTE — ED Notes (Signed)
TTS has spoken with the pts nurse, Pt is uncooperative. Pt will be assessed when she is willing to engage with this Probation officer.

## 2018-12-10 NOTE — ED Notes (Signed)
This RN spoke with Amie Critchley from [employer] who brought pt in. Per Barnabas Lister, BP was called out because pt family had concerns about her mental health, family stated that pt had not been acting right for the past few days. Pt does have mental health hx is and is on medication. Barnabas Lister states that when he arrived at pts home pt was jumping up and down and twitching. Barnabas Lister also states that pt has been talking about things that are not there as if she may be having some hallucinations. Pt family reports that pt has been crying uncontrollably for the past few days as well.  Barnabas Lister reports taking pt through the drive thur to get something to eat on the way to the hospital, Barnabas Lister states that pts demeanor seemed to change after taking her to get food, pt seemed more happy, however, pt did not really eat much of the food. Barnabas Lister states that how pt is acting while he was here with her at the hospital is not the same as she was acting at home prior to coming in.

## 2018-12-10 NOTE — ED Notes (Signed)
Pt states that she feels that she is in a daze. She just doesn't feel right.

## 2018-12-10 NOTE — Consult Note (Signed)
[facility] Face-to-Face Psychiatry Consult   Reason for Consult:  Psych evaluation Referring Physician:  Dr. Jacqualine Code Patient Identification: Rita Ellis MRN:  [id number] Principal Diagnosis: Acute psychosis [facility]) Diagnosis:  Principal Problem:   Acute psychosis ([place]) Active Problems:   Schizoaffective disorder ([place])   Total Time spent with patient: 45 minutes  Subjective:   Rita Ellis is a 33 y.o. female patient admitted with bizarre behavior.     HPI:  Rita Ellis, 33 y.o., female patient seen face to face  by this provider; chart reviewed and consulted with Dr. Jacqualine Code  on 12/10/18.  On evaluation Rita Ellis reports that she is here because she is tired.  When asked her to elaborate, for instance, tired of what? Writer was met with a blank stare. When writer was asking orientation questions, pt would just look blankly and not answer. Pt finally provided her birthday, however, it took a few moments for for her to say it.  Writer ask again, what brings her here and pt responds by shrugging her shoulders.  Pt says that she hasnt taken her meds in a while.  When asked what meds she referring to, she states ibuprofen for her teeth.  Pt denies psych background, psych meds, etc.  Pt is a poor historian.     Per Rn Note on intake:   This RN spoke with Amie Critchley from [facility] who brought pt in. Per Barnabas Lister, BP was called out because pt family had concerns about her mental health, family stated that pt had not been acting right for the past few days. Pt does have mental health hx is and is on medication. Barnabas Lister states that when he arrived at pts home pt was jumping up and down and twitching. Barnabas Lister also states that pt has been talking about things that are not there as if she may be having some hallucinations. Pt family reports that pt has been crying uncontrollably for the past few days as well.  Barnabas Lister reports taking pt through the drive thur to get something to eat on the way to the hospital, Barnabas Lister states that  pts demeanor seemed to change after taking her to get food, pt seemed more happy, however, pt did not really eat much of the food. Barnabas Lister states that how pt is acting while he was here with her at the hospital is not the same as she was acting at home prior to coming in.           During evaluation Rita Ellis is sitting on her bed, she is alert/oriented to self only; her mood is bizzarre  with a flat affect.  Patient is speaking in a low tone at moderate volume, and slow pace; pt appears to be thought blocking, with poor eye contact.  Her thought process is irrelevant and blocked; There is  indication that she is currently responding to internal stimuli and/or experiencing delusional thought content.  Patient denies suicidal/self-harm/homicidal ideation.  At this time, writer does not believe patient can care for herself due to her current mental state.    Past Psychiatric History: yes. History of schizophrenia   Risk to Self:   Risk to Others:   Prior Inpatient Therapy:   Prior Outpatient Therapy:    Past Medical History: History reviewed. No pertinent past medical history.  Past Surgical History:  Procedure Laterality Date  . CESAREAN SECTION     Family History: History reviewed. No pertinent family history. Family Psychiatric  History: unknown Social  History:  Social History   Substance and Sexual Activity  Alcohol Use Not Currently  . Frequency: Never     Social History   Substance and Sexual Activity  Drug Use Never    Social History   Socioeconomic History  . Marital status: Single    Spouse name: Not on file  . Number of children: Not on file  . Years of education: Not on file  . Highest education level: Not on file  Occupational History  . Not on file  Social Needs  . Financial resource strain: Not on file  . Food insecurity    Worry: Not on file    Inability: Not on file  . Transportation needs    Medical: Not on file    Non-medical: Not on file  Tobacco  Use  . Smoking status: Never Smoker  . Smokeless tobacco: Never Used  Substance and Sexual Activity  . Alcohol use: Not Currently    Frequency: Never  . Drug use: Never  . Sexual activity: Not on file  Lifestyle  . Physical activity    Days per week: Not on file    Minutes per session: Not on file  . Stress: Not on file  Relationships  . Social Herbalist on phone: Not on file    Gets together: Not on file    Attends religious service: Not on file    Active member of club or organization: Not on file    Attends meetings of clubs or organizations: Not on file    Relationship status: Not on file  Other Topics Concern  . Not on file  Social History Narrative  . Not on file   Additional Social History:    Allergies:  No Known Allergies  Labs:  Results for orders placed or performed during the hospital encounter of 12/10/18 (from the past 48 hour(s))  Comprehensive metabolic panel     Status: Abnormal   Collection Time: 12/10/18  6:20 PM  Result Value Ref Range   Sodium 137 135 - 145 mmol/L   Potassium 3.2 (L) 3.5 - 5.1 mmol/L   Chloride 99 98 - 111 mmol/L   CO2 26 22 - 32 mmol/L   Glucose, Bld 130 (H) 70 - 99 mg/dL   BUN 10 6 - 20 mg/dL   Creatinine, Ser 0.91 0.44 - 1.00 mg/dL   Calcium 9.3 8.9 - 10.3 mg/dL   Total Protein 8.5 (H) 6.5 - 8.1 g/dL   Albumin 4.7 3.5 - 5.0 g/dL   AST 28 15 - 41 U/L   ALT 28 0 - 44 U/L   Alkaline Phosphatase 41 38 - 126 U/L   Total Bilirubin 0.6 0.3 - 1.2 mg/dL   GFR calc non Af Amer >60 >60 mL/min   GFR calc Af Amer >60 >60 mL/min   Anion gap 12 5 - 15    Comment: Performed at [facility], [address] [place], [place] [address]  Ethanol     Status: None   Collection Time: 12/10/18  6:20 PM  Result Value Ref Range   Alcohol, Ethyl (B) <10 <10 mg/dL    Comment: (NOTE) Lowest detectable limit for serum alcohol is 10 mg/dL. For medical purposes only. Performed at [facility], [address]  [place],  [address]   Salicylate level     Status: None   Collection Time: 12/10/18  6:20 PM  Result Value Ref Range   Salicylate Lvl <7.5 2.8 -  30.0 mg/dL    Comment: Performed at [facility], [address] [place], [place] [address]  Acetaminophen level     Status: Abnormal   Collection Time: 12/10/18  6:20 PM  Result Value Ref Range   Acetaminophen (Tylenol), Serum <10 (L) 10 - 30 ug/mL    Comment: (NOTE) Therapeutic concentrations vary significantly. A range of 10-30 ug/mL  may be an effective concentration for many patients. However, some  are best treated at concentrations outside of this range. Acetaminophen concentrations >150 ug/mL at 4 hours after ingestion  and >50 ug/mL at 12 hours after ingestion are often associated with  toxic reactions. Performed at [facility], [address] [place], [place] [address]   cbc     Status: None   Collection Time: 12/10/18  6:20 PM  Result Value Ref Range   WBC 6.9 4.0 - 10.5 K/uL   RBC 4.61 3.87 - 5.11 MIL/uL   Hemoglobin 12.9 12.0 - 15.0 g/dL   HCT 37.7 36.0 - 46.0 %   MCV 81.8 80.0 - 100.0 fL   MCH 28.0 26.0 - 34.0 pg   MCHC 34.2 30.0 - 36.0 g/dL   RDW 12.8 11.5 - 15.5 %   Platelets 242 150 - 400 K/uL   nRBC 0.0 0.0 - 0.2 %    Comment: Performed at [facility], [address], [place], [place] [address]  Urine Drug Screen, Qualitative     Status: None   Collection Time: 12/10/18  6:20 PM  Result Value Ref Range   Tricyclic, Ur Screen NONE DETECTED NONE DETECTED   Amphetamines, Ur Screen NONE DETECTED NONE DETECTED   MDMA (Ecstasy)Ur Screen NONE DETECTED NONE DETECTED   Cocaine Metabolite,Ur [place] NONE DETECTED NONE DETECTED   Opiate, Ur Screen NONE DETECTED NONE DETECTED   Phencyclidine (PCP) Ur S NONE DETECTED NONE DETECTED   Cannabinoid 50 Ng, Ur [place] NONE DETECTED NONE DETECTED   Barbiturates, Ur Screen NONE DETECTED NONE DETECTED   Benzodiazepine, Ur Scrn NONE DETECTED NONE DETECTED    Methadone Scn, Ur NONE DETECTED NONE DETECTED    Comment: (NOTE) Tricyclics + metabolites, urine    Cutoff 1000 ng/mL Amphetamines + metabolites, urine  Cutoff 1000 ng/mL MDMA (Ecstasy), urine              Cutoff 500 ng/mL Cocaine Metabolite, urine          Cutoff 300 ng/mL Opiate + metabolites, urine        Cutoff 300 ng/mL Phencyclidine (PCP), urine         Cutoff 25 ng/mL Cannabinoid, urine                 Cutoff 50 ng/mL Barbiturates + metabolites, urine  Cutoff 200 ng/mL Benzodiazepine, urine              Cutoff 200 ng/mL Methadone, urine                   Cutoff 300 ng/mL The urine drug screen provides only a preliminary, unconfirmed analytical test result and should not be used for non-medical purposes. Clinical consideration and professional judgment should be applied to any positive drug screen result due to possible interfering substances. A more specific alternate chemical method must be used in order to obtain a confirmed analytical result. Gas chromatography / mass spectrometry (GC/MS) is the preferred confirmat ory method. Performed at [facility], [address], [place],  [address]   Pregnancy, urine  Status: None   Collection Time: 12/10/18  6:20 PM  Result Value Ref Range   Preg Test, Ur NEGATIVE NEGATIVE    Comment: Performed at [facility], [address] [place],  [address]    Current Facility-Administered Medications  Medication Dose Route Frequency Provider Last Rate Last Dose  . potassium chloride SA (KLOR-CON) CR tablet 40 mEq  40 mEq Oral Once Delman Kitten, MD       Current Outpatient Medications  Medication Sig Dispense Refill  . famotidine (PEPCID) 20 MG tablet Take 1 tablet (20 mg total) by mouth 2 (two) times daily. 60 tablet 0  . ibuprofen (ADVIL,MOTRIN) 800 MG tablet Take 1 tablet (800 mg total) by mouth every 8 (eight) hours as needed for moderate pain. 15 tablet 0  . traMADol (ULTRAM) 50 MG tablet Take 1  tablet (50 mg total) by mouth every 6 (six) hours as needed for moderate pain. 12 tablet 0    Musculoskeletal: Strength & Muscle Tone: within normal limits Gait & Station: normal Patient leans: N/A  Psychiatric Specialty Exam: Physical Exam  Nursing note and vitals reviewed. Constitutional: She appears well-developed.  HENT:  Head: Normocephalic.  Eyes: Pupils are equal, round, and reactive to light.  Neck: Normal range of motion.  Respiratory: Effort normal.  Musculoskeletal: Normal range of motion.  Neurological: She is alert.  Skin: Skin is warm and dry.  Psychiatric: Her affect is inappropriate. Her speech is delayed. She is slowed and withdrawn. Thought content is delusional. Cognition and memory are impaired. She expresses inappropriate judgment. She is inattentive.    Review of Systems  Psychiatric/Behavioral: Negative for hallucinations and substance abuse.  All other systems reviewed and are negative.   Blood pressure (!) 142/88, pulse (!) 102, temperature 98.9 F (37.2 C), temperature source Oral, resp. rate 17, height 5' (1.524 m), weight 86.2 kg, SpO2 97 %.Body mass index is 37.11 kg/m.  General Appearance: Bizarre  Eye Contact:  Minimal  Speech:  Garbled  Volume:  Decreased  Mood:  Dysphoric  Affect:  Flat and Inappropriate  Thought Process:  Disorganized, Irrelevant and Descriptions of Associations: Loose  Orientation:  Other:  oriented to self only  Thought Content:  Illogical and Delusions  Suicidal Thoughts:  No  Homicidal Thoughts:  No  Memory:  NA  Judgement:  Impaired  Insight:  Lacking  Psychomotor Activity:  NA  Concentration:  Concentration: Poor  Recall:  Poor  Fund of Knowledge:  Poor  Language:  Good  Akathisia:  NA  Handed:  Right  AIMS (if indicated):     Assets:  Social Support  ADL's:  Impaired  Cognition:  Impaired,  Moderate  Sleep:      Recommendation: Psychiatric inpatient hospitalization for medication management and  psychiatric statbilization.    Spoke with Dr. Edd Fabian and TTS ; informed of above recommendation and disposition  Treatment Plan Summary: Medication management and Plan to admit to [facility] for psychiatric stabilization  Disposition: Recommend psychiatric Inpatient admission when medically cleared. Supportive therapy provided about ongoing stressors.  Deloria Lair, NP 12/10/2018 11:03 PM

## 2018-12-10 NOTE — ED Provider Notes (Signed)
[facility] Emergency Department Provider Note   ____________________________________________   First MD Initiated Contact with Patient 12/10/18 2059     (approximate)  I have reviewed the triage vital signs and the nursing notes.   HISTORY  Chief Complaint Psychiatric Evaluation    HPI Rita Ellis is a 33 y.o. female reports a previous history of schizophrenia  Patient reports that she has been low in mood.  She did not sleep well last night she has been up thinking.  Denies any desire to hurt her self or anyone else.  Denies suicidal ideation.  Reports that she has schizophrenia but has not been taking medicine for it for a long time.  She denies wanting to hurt herself or anyone else.  Denies any overdose or ingestion  Denies recent illness.  Reports she is not any pain or discomfort.  Just reports that she feels sad, she wants to be home with her kids because she knows they probably have homework to do   History reviewed. No pertinent past medical history.  Patient Active Problem List   Diagnosis Date Noted  . Schizoaffective disorder (H[facility]) 12/10/2018  . Acute psychosis ([facility]) 12/10/2018    Past Surgical History:  Procedure Laterality Date  . CESAREAN SECTION      Prior to Admission medications   Medication Sig Start Date End Date Taking? Authorizing Provider  famotidine (PEPCID) 20 MG tablet Take 1 tablet (20 mg total) by mouth 2 (two) times daily. 12/08/17 12/08/18  Myrna Blazer, MD  ibuprofen (ADVIL,MOTRIN) 800 MG tablet Take 1 tablet (800 mg total) by mouth every 8 (eight) hours as needed for moderate pain. 02/17/16   Joni Reining, PA-C  traMADol (ULTRAM) 50 MG tablet Take 1 tablet (50 mg total) by mouth every 6 (six) hours as needed for moderate pain. 02/17/16   Joni Reining, PA-C  Currently not any medications  Allergies Patient has no known allergies.  History reviewed. No pertinent family history.  Social  History Social History   Tobacco Use  . Smoking status: Never Smoker  . Smokeless tobacco: Never Used  Substance Use Topics  . Alcohol use: Not Currently    Frequency: Never  . Drug use: Never    Review of Systems Constitutional: No fever/chills Eyes: No visual changes. ENT: No sore throat. Cardiovascular: Denies chest pain. Respiratory: Denies shortness of breath. Gastrointestinal: No abdominal pain.   Genitourinary: No urinary problems Musculoskeletal: Negative for aches or pains Neurological: Negative for headaches or weakness. Psychiatric: Denies hallucinations denies desire to harm herself or anyone else.  Does report poor sleep, difficulty sleeping.  Supposed to be on medication for "schizophrenia" but reports she has been taking it for a long time   ____________________________________________   PHYSICAL EXAM:  VITAL SIGNS: ED Triage Vitals  Enc Vitals Group     BP 12/10/18 1757 (!) 142/88     Pulse Rate 12/10/18 1757 (!) 102     Resp 12/10/18 1757 17     Temp 12/10/18 1757 98.9 F (37.2 C)     Temp Source 12/10/18 1757 Oral     SpO2 12/10/18 1757 97 %     Weight 12/10/18 1754 190 lb (86.2 kg)     Height 12/10/18 1754 5' (1.524 m)     Head Circumference --      Peak Flow --      Pain Score 12/10/18 1754 0     Pain Loc --  Pain Edu? --      Excl. in [place]? --     Constitutional: Alert and oriented. Well appearing and in no acute distress. Eyes: Conjunctivae are normal. Head: Atraumatic. Nose: No congestion/rhinnorhea. Mouth/Throat: Mucous membranes are moist. Neck: No stridor.  Cardiovascular: Normal rate, regular rhythm. Grossly normal heart sounds.  Good peripheral circulation. Respiratory: Normal respiratory effort.  No retractions. Lungs CTAB. Gastrointestinal: Soft and nontender. No distention. Musculoskeletal: No lower extremity tenderness nor edema. Neurologic:  Normal speech and language. No gross focal neurologic deficits are appreciated.   Skin:  Skin is warm, dry and intact. No rash noted. Psychiatric: Mood and affect are flat. Speech and behavior are normal.  ____________________________________________   LABS (all labs ordered are listed, but only abnormal results are displayed)  Labs Reviewed  COMPREHENSIVE METABOLIC PANEL - Abnormal; Notable for the following components:      Result Value   Potassium 3.2 (*)    Glucose, Bld 130 (*)    Total Protein 8.5 (*)    All other components within normal limits  ACETAMINOPHEN LEVEL - Abnormal; Notable for the following components:   Acetaminophen (Tylenol), Serum <10 (*)    All other components within normal limits  SARS CORONAVIRUS 2 (TAT 6-24 HRS)  ETHANOL  SALICYLATE LEVEL  CBC  URINE DRUG SCREEN, QUALITATIVE ([facility] ONLY)  PREGNANCY, URINE   ____________________________________________  EKG   ____________________________________________  RADIOLOGY   ____________________________________________   PROCEDURES  Procedure(s) performed: None  Procedures  Critical Care performed: No  ____________________________________________   INITIAL IMPRESSION / ASSESSMENT AND PLAN / ED COURSE  Pertinent labs & imaging results that were available during my care of the patient were reviewed by me and considered in my medical decision making (see chart for details).   ----------------------------------------- 9:09 PM on 12/10/2018 -----------------------------------------    Patient medically cleared for psychiatric consultation.  Discussed with charge nurse, in-house psychiatric team available, placed consult to psych team.  Will have patient remain voluntary as she does not not report any acute symptoms suggest risk of self-harm or harm to others at this time.  Does not appear to be responding to internal stimuli by my exam.  Denies acute medical illness with reassuring clinical examination    ----------------------------------------- 11:04 PM on  12/10/2018 -----------------------------------------  Ongoing care and disposition assigned to Dr. Alfred Levins.  Follow-up recommendations from psychiatry team  ____________________________________________   FINAL CLINICAL IMPRESSION(S) / ED DIAGNOSES  Final diagnoses:  Schizophrenia, unspecified type ([place])        Note:  This document was prepared using Dragon voice recognition software and may include unintentional dictation errors       Delman Kitten, MD 12/10/18 2304

## 2018-12-10 NOTE — ED Notes (Signed)
Psychiatry at bedside.

## 2018-12-10 NOTE — ED Notes (Signed)
Pt belongings include one pink jacket, one yellow necklace, one teal shirt, one purple bra, one pair pants, two black shoes, two black socks.

## 2018-12-10 NOTE — ED Notes (Signed)
ED Provider at bedside. 

## 2018-12-10 NOTE — ED Notes (Signed)
Pt requests to go to restroom and throw trash away. Side rail let down. Pt then changes her mind and doesn't want to go. Pt sitting on bed still.

## 2018-12-10 NOTE — ED Triage Notes (Signed)
Pt came in by officers when her brother called because she had been crying all day and didn't want to get up. Pt denies any SI or HI. Pt laughing saying she just misses her kids right now stating they are at home. Pt AOx4. Pt attempted to get out of wheelchair to urinate and accidentally urinated on herself.

## 2018-12-10 NOTE — ED Notes (Signed)
This RN attempted to get pt in wheelchair to take her to interview room to speak with TTS. Pt very somnolent when asked to get in the wheelchair stating, "What about my food" and "yeah I'll get in the chair" but never moves. Attempted to call TTS but line was busy. Will retry in a few minutes.

## 2018-12-11 ENCOUNTER — Inpatient Hospital Stay
Admission: RE | Admit: 2018-12-11 | Discharge: 2018-12-16 | DRG: 885 | Disposition: A | Discharge status: Home / Self Care | Payer: Medicaid Other | Attending: Psychiatry | Admitting: Psychiatry

## 2018-12-11 ENCOUNTER — Other Ambulatory Visit: Payer: Self-pay

## 2018-12-11 DIAGNOSIS — K0889 Other specified disorders of teeth and supporting structures: Secondary | ICD-10-CM | POA: Diagnosis present

## 2018-12-11 DIAGNOSIS — K219 Gastro-esophageal reflux disease without esophagitis: Secondary | ICD-10-CM | POA: Diagnosis present

## 2018-12-11 DIAGNOSIS — F250 Schizoaffective disorder, bipolar type: Secondary | ICD-10-CM | POA: Diagnosis not present

## 2018-12-11 DIAGNOSIS — F17200 Nicotine dependence, unspecified, uncomplicated: Secondary | ICD-10-CM | POA: Diagnosis present

## 2018-12-11 DIAGNOSIS — G4700 Insomnia, unspecified: Secondary | ICD-10-CM | POA: Diagnosis present

## 2018-12-11 DIAGNOSIS — F23 Brief psychotic disorder: Secondary | ICD-10-CM

## 2018-12-11 DIAGNOSIS — F259 Schizoaffective disorder, unspecified: Principal | ICD-10-CM | POA: Diagnosis present

## 2018-12-11 DIAGNOSIS — Z20828 Contact with and (suspected) exposure to other viral communicable diseases: Secondary | ICD-10-CM | POA: Diagnosis present

## 2018-12-11 LAB — SARS CORONAVIRUS 2 BY RT PCR (HOSPITAL ORDER, PERFORMED IN ~~LOC~~ HOSPITAL LAB): SARS Coronavirus 2: NEGATIVE

## 2018-12-11 MED ORDER — TRAZODONE HCL 50 MG PO TABS: 50.0000 mg | ORAL_TABLET | Freq: Every evening | ORAL | Status: DC | PRN

## 2018-12-11 MED ORDER — IBUPROFEN 600 MG PO TABS
800.0000 mg | ORAL_TABLET | Freq: Three times a day (TID) | ORAL | Status: DC | PRN
  Administered 2018-12-14: 800 mg via ORAL
  Filled 2018-12-11: qty 1

## 2018-12-11 MED ORDER — ACETAMINOPHEN 325 MG PO TABS: 650.0000 mg | ORAL_TABLET | Freq: Four times a day (QID) | ORAL | Status: DC | PRN

## 2018-12-11 MED ORDER — LORAZEPAM 2 MG/ML IJ SOLN
2.0000 mg | Freq: Once | INTRAMUSCULAR | Status: AC
  Administered 2018-12-11: 2 mg via INTRAMUSCULAR
  Filled 2018-12-11: qty 1

## 2018-12-11 MED ORDER — HYDROXYZINE HCL 25 MG PO TABS: 25.0000 mg | ORAL_TABLET | Freq: Three times a day (TID) | ORAL | Status: DC | PRN

## 2018-12-11 MED ORDER — ALUM & MAG HYDROXIDE-SIMETH 200-200-20 MG/5ML PO SUSP
30.0000 mL | ORAL | Status: DC | PRN
  Administered 2018-12-14 – 2018-12-15 (×2): 30 mL via ORAL
  Filled 2018-12-11 (×2): qty 30

## 2018-12-11 MED ORDER — DIPHENHYDRAMINE HCL 50 MG/ML IJ SOLN
25.0000 mg | Freq: Once | INTRAMUSCULAR | Status: AC
  Administered 2018-12-11: 25 mg via INTRAMUSCULAR
  Filled 2018-12-11: qty 1

## 2018-12-11 MED ORDER — HALOPERIDOL LACTATE 5 MG/ML IJ SOLN
5.0000 mg | Freq: Once | INTRAMUSCULAR | Status: AC
  Administered 2018-12-11: 5 mg via INTRAMUSCULAR
  Filled 2018-12-11: qty 1

## 2018-12-11 MED ORDER — MAGNESIUM HYDROXIDE 400 MG/5ML PO SUSP: 30.0000 mL | Freq: Every day | ORAL | Status: DC | PRN

## 2018-12-11 NOTE — ED Notes (Signed)
This RN asked pt if there was anything she needed that we could provide for her. Pt would not make eye contact or speak with this RN.

## 2018-12-11 NOTE — ED Notes (Signed)
Pt asking to use the restroom. Pt was informed no one is in the restroom and she could go to the bathroom. Pt continues to sit on bed, not getting up to go to restroom.

## 2018-12-11 NOTE — ED Provider Notes (Signed)
Psychiatry has seen and evaluated. They are recommending inpatient hospitalization for medication management and psychiatric stabilization.  Patient now refusing care and does not cooperate in evaluation.  As such, will pursue IVC for patient safety to ensure adequate psychiatric treatment.  Patient to be admitted here.   Lilia Pro., MD 12/11/18 [phone]

## 2018-12-11 NOTE — ED Notes (Signed)
Pt informed of admission to [facility] unit and transported by [facility] with ED Medic and [facility]. Pt NAD at this time.

## 2018-12-11 NOTE — ED Notes (Signed)
Pt given food tray and beverage.

## 2018-12-11 NOTE — ED Notes (Signed)
Pt states pants are dirty. Pt smells of urine. Pt given clean pants.

## 2018-12-11 NOTE — ED Notes (Signed)
Pt up, walking to restroom, pt stopped and stated "there is no bathroom over there". Door was opened and pt shown that there is in fact, a restroom at the end of the hall. Pt ambulatory to restroom without difficulty or assistance.

## 2018-12-11 NOTE — Progress Notes (Signed)
D: Upon arriving at shift change, patient was naked and urinating on the floor. Banging on doors. Running down the hallway. Saying something about someone who had "killed the kids" Day shift said she was walking into other people's rooms. Saying sexually inappropriate things to patients and staff. Grabbed a nurse's arm. Dr. Weber Cooks called and order obtained for 1:1 while awake and IM medication. Haldol 5 mg IM, Ativan 2 mg IM and Benadryl 25 mg IM given per one time order.  A: Continue 1:1 for safety R: Safety maintained.

## 2018-12-11 NOTE — ED Notes (Signed)
Pt's father came to visit.

## 2018-12-11 NOTE — Tx Team (Signed)
Initial Treatment Plan 12/11/2018 4:53 PM SWATHI DAUPHIN [id number]    PATIENT STRESSORS: Financial difficulties Health problems   PATIENT STRENGTHS: Active sense of humor Supportive family/friends   PATIENT IDENTIFIED PROBLEMS: Ineffective coping skills  Unstable mood                   DISCHARGE CRITERIA:  Ability to meet basic life and health needs Adequate post-discharge living arrangements Improved stabilization in mood, thinking, and/or behavior  PRELIMINARY DISCHARGE PLAN: Attend aftercare/continuing care group Outpatient therapy Return to previous living arrangement Return to previous work or school arrangements  PATIENT/FAMILY INVOLVEMENT: This treatment plan has been presented to and reviewed with the patient, ZENDAYA GROSECLOSE.  The patient and family have been given the opportunity to ask questions and make suggestions.  Anson [place], RN 12/11/2018, 4:53 PM

## 2018-12-11 NOTE — BH Assessment (Signed)
Assessment Note  Rita Ellis is an 33 y.o. female. Who present voluntarily with a history of Schizoaffective disorder. Pt presents as flat and constricted although she is cooperative with the assessment process. Clt states that she currently lives with her mother and her two children.She shares that her mother is her primary source of support.  She confirms her mental health history. Pt states that she has been out of her medication for a while. Pt is unable to quantify the about of time she has been non-compliant. She denies any previous inpatient hospitalizations or current outpatient therapy. Pt has acknowledged that she has been more stressed lately, although Pt. denies any suicidal ideation, plan or intent. Pt. denies the presence of any auditory or visual hallucinations at this time. Patient denies any other medical complaints.      Diagnosis: Schizoaffective Disdorder   Past Medical History: History reviewed. No pertinent past medical history.  Past Surgical History:  Procedure Laterality Date  . CESAREAN SECTION      Family History: History reviewed. No pertinent family history.  Social History:  reports that she has never smoked. She has never used smokeless tobacco. She reports previous alcohol use. She reports that she does not use drugs.  Additional Social History:     CIWA: CIWA-Ar BP: (!) 142/88 Pulse Rate: (!) 102 COWS:    Allergies: No Known Allergies  Home Medications: (Not in a hospital admission)   OB/GYN Status:  No LMP recorded.  General Assessment Data Assessment unable to be completed: Yes Location of Assessment: [facility] ED TTS Assessment: In system Is this a Tele or Face-to-Face Assessment?: Tele Assessment Is this an Initial Assessment or a Re-assessment for this encounter?: Initial Assessment Language Other than English: No Living Arrangements: Other (Comment) What gender do you identify as?: Female Marital status: Single Living Arrangements:  Parent, Children Can pt return to current living arrangement?: Yes Admission Status: Voluntary Is patient capable of signing voluntary admission?: Yes Referral Source: [facility] type: Medicaid  Medical Screening Exam [facility] Walk-in ONLY) Medical Exam completed: Yes  Crisis Care Plan Living Arrangements: Parent, Children Legal Guardian: Other:(Self) Name of Psychiatrist: None  Name of Therapist: none  Education Status Is patient currently in school?: No  Risk to self with the past 6 months Suicidal Ideation: No Has patient been a risk to self within the past 6 months prior to admission? : No Suicidal Intent: No Has patient had any suicidal intent within the past 6 months prior to admission? : No Is patient at risk for suicide?: No, but patient needs Medical Clearance Suicidal Plan?: No Has patient had any suicidal plan within the past 6 months prior to admission? : No Access to Means: No What has been your use of drugs/alcohol within the last 12 months?: THC Previous Attempts/Gestures: No How many times?: 0 Intentional Self Injurious Behavior: (UTA) Family Suicide History: Unable to assess Recent stressful life event(s): Other (Comment)(UTA) Persecutory voices/beliefs?: No Depression: Yes Depression Symptoms: Feeling angry/irritable, Insomnia Substance abuse history and/or treatment for substance abuse?: Yes Suicide prevention information given to non-admitted patients: Not applicable  Risk to Others within the past 6 months Homicidal Ideation: No Does patient have any lifetime risk of violence toward others beyond the six months prior to admission? : No Thoughts of Harm to Others: No Current Homicidal Intent: No Current Homicidal Plan: No Access to Homicidal Means: No Identified Victim: N History of harm to others?: No Assessment of Violence: None Noted Violent Behavior Description: N  Does patient have access to weapons?: No Criminal Charges Pending?:  No Does patient have a court date: No Is patient on probation?: No  Psychosis Hallucinations: None noted Delusions: None noted  Mental Status Report Appearance/Hygiene: Disheveled, In scrubs Eye Contact: Fair Motor Activity: Freedom of movement Speech: Slow, Logical/coherent Level of Consciousness: Alert Mood: Depressed, Irritable Affect: Irritable Anxiety Level: None Thought Processes: Coherent, Thought Blocking Judgement: Partial Orientation: Time, Place, Person, Situation Obsessive Compulsive Thoughts/Behaviors: None  Cognitive Functioning Concentration: Decreased Memory: Remote Intact, Recent Intact Is patient IDD: No Insight: Poor Impulse Control: Fair Appetite: Fair Have you had any weight changes? : No Change Sleep: Unable to Assess Vegetative Symptoms: None  ADLScreening [facility] Assessment Services) Patient's cognitive ability adequate to safely complete daily activities?: Yes Patient able to express need for assistance with ADLs?: Yes Independently performs ADLs?: Yes (appropriate for developmental age)  Prior Inpatient Therapy Prior Inpatient Therapy: No  Prior Outpatient Therapy Prior Outpatient Therapy: No Does patient have an ACCT team?: No Does patient have Intensive In-House Services?  : No Does patient have [facility] services? : No Does patient have P4CC services?: No  ADL Screening (condition at time of admission) Patient's cognitive ability adequate to safely complete daily activities?: Yes Patient able to express need for assistance with ADLs?: Yes Independently performs ADLs?: Yes (appropriate for developmental age)       Abuse/Neglect Assessment (Assessment to be complete while patient is alone) Abuse/Neglect Assessment Can Be Completed: Yes Physical Abuse: Denies Verbal Abuse: Denies Sexual Abuse: Denies Exploitation of patient/patient's resources: Denies Values / Beliefs Cultural Requests During Hospitalization: None Spiritual Requests  During Hospitalization: None Consults Spiritual Care Consult Needed: No Social Work Consult Needed: No Regulatory affairs officer (For Healthcare) Does Patient Have a Medical Advance Directive?: No          Disposition:  Disposition Initial Assessment Completed for this Encounter: Yes Patient referred to: Other (Comment)(Consult with Psych NP)  On Site Evaluation by:   Reviewed with Physician:    Laretta Alstrom 12/11/2018 12:54 AM

## 2018-12-11 NOTE — BH Assessment (Signed)
Patient is to be admitted to [facility] by Psychiatric Nurse Practitioner [place].  Attending Physician will be Dr. Weber Cooks.   Patient has been assigned to room 305, by [place].   Intake Paper Work has been signed and placed on patient chart.  ER staff is aware of the admission:  Luann ER Secretary    Dr. Heywood Iles, ER MD   Maudie Mercury Patient's Nurse

## 2018-12-11 NOTE — Progress Notes (Signed)
Patient ID: Rita Ellis, female   DOB: 08/20/1985, 34 y.o.   MRN: [id number]  D: Received patient from ED. Patient skin assessment completed with Coralyn Mark RN, and [facility], skin is intact, no contraband found. Patient is denies SI/HI/AVH. Patient is very minimal and forwards little during assessment.   A: Patient oriented to unit/room/call light. Patient was offered support and encouragement. Patient was encourage to attend groups, participate in unit activities and continue with plan of care. Q x 15 minute observation checks were completed for safety.   R: Patient has no complaints at this time. Patient is receptive to treatment and safety maintained on unit.

## 2018-12-11 NOTE — ED Notes (Signed)
Pt in interview room talking to TTS.

## 2018-12-11 NOTE — ED Notes (Signed)
Writer has attempted to contact the pts mother to gather collateral, no answer. Writer did leave voicemail requesting a return call.

## 2018-12-12 DIAGNOSIS — F250 Schizoaffective disorder, bipolar type: Secondary | ICD-10-CM

## 2018-12-12 DIAGNOSIS — F17200 Nicotine dependence, unspecified, uncomplicated: Secondary | ICD-10-CM

## 2018-12-12 DIAGNOSIS — K0889 Other specified disorders of teeth and supporting structures: Secondary | ICD-10-CM

## 2018-12-12 MED ORDER — NICOTINE 21 MG/24HR TD PT24
21.0000 mg | MEDICATED_PATCH | Freq: Every day | TRANSDERMAL | Status: DC
  Administered 2018-12-12 – 2018-12-16 (×5): 21 mg via TRANSDERMAL
  Filled 2018-12-12 (×5): qty 1

## 2018-12-12 MED ORDER — HYDROXYZINE HCL 50 MG PO TABS: 50.0000 mg | ORAL_TABLET | Freq: Three times a day (TID) | ORAL | Status: DC | PRN

## 2018-12-12 MED ORDER — LORAZEPAM 2 MG/ML IJ SOLN
2.0000 mg | Freq: Four times a day (QID) | INTRAMUSCULAR | Status: DC | PRN
  Administered 2018-12-15 (×2): 2 mg via INTRAMUSCULAR
  Filled 2018-12-12 (×2): qty 1

## 2018-12-12 MED ORDER — OLANZAPINE 10 MG IM SOLR
10.0000 mg | Freq: Four times a day (QID) | INTRAMUSCULAR | Status: DC | PRN
  Administered 2018-12-15: 10 mg via INTRAMUSCULAR
  Filled 2018-12-12: qty 10

## 2018-12-12 MED ORDER — LORAZEPAM 2 MG PO TABS: 2.0000 mg | ORAL_TABLET | Freq: Four times a day (QID) | ORAL | Status: DC | PRN

## 2018-12-12 MED ORDER — QUETIAPINE FUMARATE 100 MG PO TABS
100.0000 mg | ORAL_TABLET | Freq: Every day | ORAL | Status: DC
  Administered 2018-12-12: 100 mg via ORAL
  Filled 2018-12-12: qty 1

## 2018-12-12 NOTE — Progress Notes (Signed)
1:1 Patient Hourly Rounding:  1000: Patient is in her room, asleep, with her assigned safety sitter present at bedside.
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
## 2018-12-12 NOTE — Progress Notes (Signed)
1:1 Patient has been D/C by MD.

## 2018-12-12 NOTE — BHH Suicide Risk Assessment (Signed)
[facility] Admission Suicide Risk Assessment   Nursing information obtained from:  Patient Demographic factors:  NA Current Mental Status:  NA Loss Factors:  NA Historical Factors:  NA Risk Reduction Factors:  NA  Total Time spent with patient: 1 hour Principal Problem: Schizoaffective disorder ([place]) Diagnosis:  Principal Problem:   Schizoaffective disorder ([place]) Active Problems:   Acute psychosis ([place])   Nicotine dependence   Pain, dental  Subjective Data: Patient seen and chart reviewed.  33 year old woman brought to the emergency room day before yesterday because of agitation and confusion and apparent psychotic symptoms.  Patient currently is more lucid and calm.  Able to have a lucid conversation.  Denies suicidal thoughts.  Denies homicidal thoughts.  So far today cooperative with treatment planning and medication.  Continued Clinical Symptoms:    The "Alcohol Use Disorders Identification Test", Guidelines for Use in Primary Care, Second Edition.  World Pharmacologist [facility]). Score between 0-7:  no or low risk or alcohol related problems. Score between 8-15:  moderate risk of alcohol related problems. Score between 16-19:  high risk of alcohol related problems. Score 20 or above:  warrants further diagnostic evaluation for alcohol dependence and treatment.   CLINICAL FACTORS:   Schizophrenia:   Less than 85 years old   Musculoskeletal: Strength & Muscle Tone: within normal limits Gait & Station: normal Patient leans: N/A  Psychiatric Specialty Exam: Physical Exam  Nursing note and vitals reviewed. Constitutional: She appears well-developed and well-nourished.  HENT:  Head: Normocephalic and atraumatic.  Eyes: Pupils are equal, round, and reactive to light. Conjunctivae are normal.  Neck: Normal range of motion.  Cardiovascular: Regular rhythm and normal heart sounds.  Respiratory: Effort normal. No respiratory distress.  GI: Soft.  Musculoskeletal: Normal range of  motion.  Neurological: She is alert.  Skin: Skin is warm and dry.  Psychiatric: Her affect is blunt. Her speech is delayed. She is slowed. Thought content is not paranoid. She expresses impulsivity and inappropriate judgment. She expresses no homicidal and no suicidal ideation. She exhibits abnormal recent memory.    Review of Systems  Constitutional: Negative.   HENT: Negative.   Eyes: Negative.   Respiratory: Negative.   Cardiovascular: Negative.   Gastrointestinal: Negative.   Musculoskeletal: Negative.   Skin: Negative.   Neurological: Negative.   Psychiatric/Behavioral: Positive for depression, hallucinations and memory loss. Negative for substance abuse and suicidal ideas. The patient is nervous/anxious and has insomnia.     Blood pressure (!) 134/94, pulse (!) 110, temperature 98.7 F (37.1 C), temperature source Oral, resp. rate 18, height 5' 1.02" (1.55 m), weight 99.1 kg, SpO2 100 %.Body mass index is 41.25 kg/m.  General Appearance: Casual  Eye Contact:  Fair  Speech:  Blocked  Volume:  Decreased  Mood:  Dysphoric  Affect:  Congruent  Thought Process:  Disorganized  Orientation:  Negative  Thought Content:  Hallucinations: Auditory, Rumination and Tangential  Suicidal Thoughts:  No  Homicidal Thoughts:  No  Memory:  Immediate;   Fair Recent;   Poor Remote;   Poor  Judgement:  Fair  Insight:  Fair  Psychomotor Activity:  Decreased  Concentration:  Concentration: Poor  Recall:  [employer] of Knowledge:  Fair  Language:  Fair  Akathisia:  No  Handed:  Right  AIMS (if indicated):     Assets:  Desire for Improvement Housing Physical Health Resilience Social Support  ADL's:  Impaired  Cognition:  Impaired,  Mild  Sleep:  Number of Hours:  8      COGNITIVE FEATURES THAT CONTRIBUTE TO RISK:  Thought constriction (tunnel vision)    SUICIDE RISK:   Minimal: No identifiable suicidal ideation.  Patients presenting with no risk factors but with morbid  ruminations; may be classified as minimal risk based on the severity of the depressive symptoms  PLAN OF CARE: Patient admitted to psychiatric unit.  Initially agitated and confused she seems significantly better today.  Discussed with her appropriate medication for her presumed diagnosis.  Start Seroquel orally at night.  Also adding a nicotine patch for comfort.  Encourage patient to be up and interactive with others and attending groups.  Follow-up care will be arranged at discharge  I certify that inpatient services furnished can reasonably be expected to improve the patient's condition.   Mordecai Rasmussen, MD 12/12/2018, 12:41 PM

## 2018-12-12 NOTE — BHH Group Notes (Signed)
LCSW Group Therapy Note 12/12/2018 1:15pm  Type of Therapy and Topic: Group Therapy: Feelings Around Returning Home & Establishing a Supportive Framework and Supporting Oneself When Supports Not Available  Participation Level: Active  Description of Group:  Patients first processed thoughts and feelings about upcoming discharge. These included fears of upcoming changes, lack of change, new living environments, judgements and expectations from others and overall stigma of mental health issues. The group then discussed the definition of a supportive framework, what that looks and feels like, and how do to discern it from an unhealthy non-supportive network. The group identified different types of supports as well as what to do when your family/friends are less than helpful or unavailable  Therapeutic Goals  1. Patient will identify one healthy supportive network that they can use at discharge. 2. Patient will identify one factor of a supportive framework and how to tell it from an unhealthy network. 3. Patient able to identify one coping skill to use when they do not have positive supports from others. 4. Patient will demonstrate ability to communicate their needs through discussion and/or role plays.  Summary of Patient Progress:  The patient reported she feels "nervous." Pt engaged during group session. As patients processed their anxiety about discharge and described healthy supports patient shared she is ready to be discharge.  Patients identified at least one self-care tool they were willing to use after discharge.   Therapeutic Modalities Cognitive Behavioral Therapy Motivational Interviewing   Ramsey Guadamuz  CUEBAS-COLON, LCSW 12/12/2018 12:26 PM
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
## 2018-12-12 NOTE — Plan of Care (Signed)
D- Patient alert and oriented. Patient presented in a pleasant mood on assessment stating that she slept good last night and had no complaints/concerns to voice to this Probation officer. Patient endorsed depression and anxiety, rating them a "6/10" and "5/10", reporting "because I haven't talked to my family", is why she's feeling this way. Patient denied SI, HI, AVH, and pain at this time. Patient's goal for today is to "see my kids/family".  A- Scheduled medications administered to patient, per MD orders. Support and encouragement provided.  Routine safety checks conducted every 15 minutes.  Patient informed to notify staff with problems or concerns.  R- No adverse drug reactions noted. Patient contracts for safety at this time. Patient compliant with medications and treatment plan. Patient receptive, calm, and cooperative. Patient interacts well with others on the unit.  Patient remains safe at this time.  Problem: Education: Goal: Knowledge of [place] [facility] Education information/materials will improve Outcome: Progressing Goal: Emotional status will improve Outcome: Progressing Goal: Mental status will improve Outcome: Progressing Goal: Verbalization of understanding the information provided will improve Outcome: Progressing

## 2018-12-12 NOTE — Progress Notes (Signed)
Patient stated that she would like to go over her lab work with the doctor.

## 2018-12-12 NOTE — BHH Suicide Risk Assessment (Signed)
[place] INPATIENT:  Family/Significant Other Suicide Prevention Education  Suicide Prevention Education:  Patient Refusal for Family/Significant Other Suicide Prevention Education: The patient Rita Ellis has refused to provide written consent for family/significant other to be provided Family/Significant Other Suicide Prevention Education during admission and/or prior to discharge.  Physician notified.  Mande Auvil  CUEBAS-COLON 12/12/2018, 3:46 PM

## 2018-12-12 NOTE — H&P (Signed)
Psychiatric Admission Assessment Adult  Patient Identification: Rita Ellis MRN:  [id number] Date of Evaluation:  12/12/2018 Chief Complaint:  psych Principal Diagnosis: Schizoaffective disorder (HCC) Diagnosis:  Principal Problem:   Schizoaffective disorder (HCC) Active Problems:   Acute psychosis (HCC)   Nicotine dependence   Pain, dental  History of Present Illness: Patient seen and chart reviewed.  Spoke with the patient today.  Also spoke with the patient's mother by telephone with the patient's consent.  33 year old woman presented to the emergency room 2 days ago.  Family had called 911 because the patient had been crying for days was agitated and confused.  Police report that the patient was agitated acting bizarrely and appeared to be hallucinating when they brought her to the emergency room.  Last night on admission to the unit patient was described as agitated pounding on people's doors being hypersexual taking her clothing off.  Patient on interview today has very little or very vague memories of all of that.  She does remember that she had been feeling bad at home.  Tells me she hadn't been sleeping for a few days.  She has been off of her medicine for an unknown amount of time.  Patient says her life has been more stressful recently.  She and her children are living with her mother.  Patient does not get disability and has been struggling to take care of herself.  Patient admits she was having auditory hallucinations and is still having some of them today.  She denies any thought of harming herself or anyone else.  She denies alcohol or drug abuse.  Mother confirms the history telling me that the patient has been mentally not herself for quite a long time but that for the last week things have gotten out of control with the lack of sleep and the agitation. Associated Signs/Symptoms: Depression Symptoms:  depressed mood, difficulty concentrating, disturbed sleep, (Hypo) Manic  Symptoms:  Distractibility, Impulsivity, Labiality of Mood, Anxiety Symptoms:  Excessive Worry, Psychotic Symptoms:  Hallucinations: Auditory PTSD Symptoms: Negative Total Time spent with patient: 1 hour  Past Psychiatric History: We don't have any written records.  Patient and mother both deny the patient ever having an inpatient hospitalization before.  They report that she has been to see a psychiatrist in the past.  Do not remember the name but remember that it was in [place].  Patient remembers being told that she might have schizophrenia which the mother confirms.  The only medicine the patient recalls is Seroquel.  She denies any history of suicide attempts or violence.  Denies any drug or alcohol abuse problems  Is the patient at risk to self? Yes.    Has the patient been a risk to self in the past 6 months? Yes.    Has the patient been a risk to self within the distant past? No.  Is the patient a risk to others? No.  Has the patient been a risk to others in the past 6 months? No.  Has the patient been a risk to others within the distant past? No.   Prior Inpatient Therapy:   Prior Outpatient Therapy:    Alcohol Screening: Patient refused Alcohol Screening Tool: Yes Alcohol Brief Interventions/Follow-up: Patient Refused Substance Abuse History in the last 12 months:  No. Consequences of Substance Abuse: Negative Previous Psychotropic Medications: Yes  Psychological Evaluations: Yes  Past Medical History: History reviewed. No pertinent past medical history.  Past Surgical History:  Procedure Laterality Date  . CESAREAN  SECTION     Family History: History reviewed. No pertinent family history. Family Psychiatric  History: Patient states she thinks that her mother may have depression but there is no known family history of psychosis or bipolar disorder that she can report.  The patient herself does have a young son who has autistic spectrum disorder. Tobacco Screening: Have  you used any form of tobacco in the last 30 days? (Cigarettes, Smokeless Tobacco, Cigars, and/or Pipes): Patient Refused Screening Social History:  Social History   Substance and Sexual Activity  Alcohol Use Not Currently  . Frequency: Never     Social History   Substance and Sexual Activity  Drug Use Never    Additional Social History:                           Allergies:  No Known Allergies Lab Results:  Results for orders placed or performed during the hospital encounter of 12/10/18 (from the past 48 hour(s))  Comprehensive metabolic panel     Status: Abnormal   Collection Time: 12/10/18  6:20 PM  Result Value Ref Range   Sodium 137 135 - 145 mmol/L   Potassium 3.2 (L) 3.5 - 5.1 mmol/L   Chloride 99 98 - 111 mmol/L   CO2 26 22 - 32 mmol/L   Glucose, Bld 130 (H) 70 - 99 mg/dL   BUN 10 6 - 20 mg/dL   Creatinine, Ser 0.86 0.44 - 1.00 mg/dL   Calcium 9.3 8.9 - 57.8 mg/dL   Total Protein 8.5 (H) 6.5 - 8.1 g/dL   Albumin 4.7 3.5 - 5.0 g/dL   AST 28 15 - 41 U/L   ALT 28 0 - 44 U/L   Alkaline Phosphatase 41 38 - 126 U/L   Total Bilirubin 0.6 0.3 - 1.2 mg/dL   GFR calc non Af Amer >60 >60 mL/min   GFR calc Af Amer >60 >60 mL/min   Anion gap 12 5 - 15    Comment: Performed at [facility], [address] [place], [place] [address]  Ethanol     Status: None   Collection Time: 12/10/18  6:20 PM  Result Value Ref Range   Alcohol, Ethyl (B) <10 <10 mg/dL    Comment: (NOTE) Lowest detectable limit for serum alcohol is 10 mg/dL. For medical purposes only. Performed at [facility], [address] [place], [place] [address]   Salicylate level     Status: None   Collection Time: 12/10/18  6:20 PM  Result Value Ref Range   Salicylate Lvl <7.0 2.8 - 30.0 mg/dL    Comment: Performed at [facility], [address] [place], [place] [address]  Acetaminophen level     Status: Abnormal   Collection Time: 12/10/18  6:20 PM  Result Value  Ref Range   Acetaminophen (Tylenol), Serum <10 (L) 10 - 30 ug/mL    Comment: (NOTE) Therapeutic concentrations vary significantly. A range of 10-30 ug/mL  may be an effective concentration for many patients. However, some  are best treated at concentrations outside of this range. Acetaminophen concentrations >150 ug/mL at 4 hours after ingestion  and >50 ug/mL at 12 hours after ingestion are often associated with  toxic reactions. Performed at [facility], [address] [place], [place] [address]   cbc     Status: None   Collection Time: 12/10/18  6:20 PM  Result Value Ref Range   WBC 6.9 4.0 -  10.5 K/uL   RBC 4.61 3.87 - 5.11 MIL/uL   Hemoglobin 12.9 12.0 - 15.0 g/dL   HCT 04.5 40.9 - 81.1 %   MCV 81.8 80.0 - 100.0 fL   MCH 28.0 26.0 - 34.0 pg   MCHC 34.2 30.0 - 36.0 g/dL   RDW 91.4 78.2 - 95.6 %   Platelets 242 150 - 400 K/uL   nRBC 0.0 0.0 - 0.2 %    Comment: Performed at [facility], [address], [place], [place] [address]  Urine Drug Screen, Qualitative     Status: None   Collection Time: 12/10/18  6:20 PM  Result Value Ref Range   Tricyclic, Ur Screen NONE DETECTED NONE DETECTED   Amphetamines, Ur Screen NONE DETECTED NONE DETECTED   MDMA (Ecstasy)Ur Screen NONE DETECTED NONE DETECTED   Cocaine Metabolite,Ur [place] NONE DETECTED NONE DETECTED   Opiate, Ur Screen NONE DETECTED NONE DETECTED   Phencyclidine (PCP) Ur S NONE DETECTED NONE DETECTED   Cannabinoid 50 Ng, Ur [address] NONE DETECTED NONE DETECTED   Barbiturates, Ur Screen NONE DETECTED NONE DETECTED   Benzodiazepine, Ur Scrn NONE DETECTED NONE DETECTED   Methadone Scn, Ur NONE DETECTED NONE DETECTED    Comment: (NOTE) Tricyclics + metabolites, urine    Cutoff 1000 ng/mL Amphetamines + metabolites, urine  Cutoff 1000 ng/mL MDMA (Ecstasy), urine              Cutoff 500 ng/mL Cocaine Metabolite, urine          Cutoff 300 ng/mL Opiate + metabolites, urine        Cutoff 300 ng/mL Phencyclidine  (PCP), urine         Cutoff 25 ng/mL Cannabinoid, urine                 Cutoff 50 ng/mL Barbiturates + metabolites, urine  Cutoff 200 ng/mL Benzodiazepine, urine              Cutoff 200 ng/mL Methadone, urine                   Cutoff 300 ng/mL The urine drug screen provides only a preliminary, unconfirmed analytical test result and should not be used for non-medical purposes. Clinical consideration and professional judgment should be applied to any positive drug screen result due to possible interfering substances. A more specific alternate chemical method must be used in order to obtain a confirmed analytical result. Gas chromatography / mass spectrometry (GC/MS) is the preferred confirmat ory method. Performed at [facility], [address] [place], [place] [address]   Pregnancy, urine     Status: None   Collection Time: 12/10/18  6:20 PM  Result Value Ref Range   Preg Test, Ur NEGATIVE NEGATIVE    Comment: Performed at [facility], [address] [place], [place] [address]  SARS Coronavirus 2 by RT PCR (hospital order, performed in [facility] hospital lab) Nasopharyngeal Nasopharyngeal Swab     Status: None   Collection Time: 12/11/18 12:58 AM   Specimen: Nasopharyngeal Swab  Result Value Ref Range   SARS Coronavirus 2 NEGATIVE NEGATIVE    Comment: (NOTE) SARS-CoV-2 target nucleic acids are NOT DETECTED. The SARS-CoV-2 RNA is generally detectable in upper and lower respiratory specimens during the acute phase of infection. The lowest concentration of SARS-CoV-2 viral copies this assay can detect is 250 copies / mL. A negative result does not preclude SARS-CoV-2 infection and should not be used as the sole basis  for treatment or other patient management decisions.  A negative result may occur with improper specimen collection / handling, submission of specimen other than nasopharyngeal swab, presence of viral mutation(s) within the areas targeted by  this assay, and inadequate number of viral copies (<250 copies / mL). A negative result must be combined with clinical observations, patient history, and epidemiological information. Fact Sheet for Patients:   BoilerBrush.com.cyhttps://www.fda.gov/media/136312/download Fact Sheet for Healthcare Providers: https://pope.com/https://www.fda.gov/media/136313/download This test is not yet approved or cleared  by the [place] FDA and has been authorized for detection and/or diagnosis of SARS-CoV-2 by FDA under an Emergency Use Authorization (EUA).  This EUA will remain in effect (meaning this test can be used) for the duration of the COVID-19 declaration under Section 564(b)(1) of the Act, 21 U.S.C. section 360bbb-3(b)(1), unless the authorization is terminated or revoked sooner. Performed at [facility], [address] [place], [place] [address]     Blood Alcohol level:  Lab Results  Component Value Date   [facility] <10 12/10/2018    Metabolic Disorder Labs:  No results found for: HGBA1C, MPG No results found for: PROLACTIN No results found for: CHOL, TRIG, HDL, CHOLHDL, VLDL, LDLCALC  Current Medications: Current Facility-Administered Medications  Medication Dose Route Frequency Provider Last Rate Last Dose  . acetaminophen (TYLENOL) tablet 650 mg  650 mg Oral Q6H PRN Dixon, Rashaun M, NP      . alum & mag hydroxide-simeth (MAALOX/MYLANTA) 200-200-20 MG/5ML suspension 30 mL  30 mL Oral Q4H PRN Dixon, Rashaun M, NP      . hydrOXYzine (ATARAX/VISTARIL) tablet 25 mg  25 mg Oral TID PRN Jearld Leschixon, Rashaun M, NP      . ibuprofen (ADVIL) tablet 800 mg  800 mg Oral Q8H PRN Dixon, Rashaun M, NP      . magnesium hydroxide (MILK OF MAGNESIA) suspension 30 mL  30 mL Oral Daily PRN Dixon, Rashaun M, NP      . nicotine (NICODERM CQ - dosed in mg/24 hours) patch 21 mg  21 mg Transdermal Daily Clapacs, John T, MD      . QUEtiapine (SEROQUEL) tablet 100 mg  100 mg Oral QHS Clapacs, Jackquline DenmarkJohn T, MD       PTA  Medications: Medications Prior to Admission  Medication Sig Dispense Refill Last Dose  . famotidine (PEPCID) 20 MG tablet Take 1 tablet (20 mg total) by mouth 2 (two) times daily. 60 tablet 0   . ibuprofen (ADVIL,MOTRIN) 800 MG tablet Take 1 tablet (800 mg total) by mouth every 8 (eight) hours as needed for moderate pain. (Patient not taking: Reported on 12/11/2018) 15 tablet 0   . traMADol (ULTRAM) 50 MG tablet Take 1 tablet (50 mg total) by mouth every 6 (six) hours as needed for moderate pain. (Patient not taking: Reported on 12/11/2018) 12 tablet 0     Musculoskeletal: Strength & Muscle Tone: within normal limits Gait & Station: normal Patient leans: N/A  Psychiatric Specialty Exam: Physical Exam  Nursing note and vitals reviewed. Constitutional: She appears well-developed and well-nourished.  HENT:  Head: Normocephalic and atraumatic.  Eyes: Pupils are equal, round, and reactive to light. Conjunctivae are normal.  Neck: Normal range of motion.  Cardiovascular: Regular rhythm and normal heart sounds.  Respiratory: Effort normal. No respiratory distress.  GI: Soft.  Musculoskeletal: Normal range of motion.  Neurological: She is alert.  Skin: Skin is warm and dry.  Psychiatric: Her affect is blunt. Her speech is delayed. She is slowed. Cognition and memory  are impaired. She expresses impulsivity. She expresses no homicidal and no suicidal ideation. She exhibits abnormal recent memory and abnormal remote memory.    Review of Systems  Constitutional: Negative.   HENT: Negative.   Eyes: Negative.   Respiratory: Negative.   Cardiovascular: Negative.   Gastrointestinal: Negative.   Musculoskeletal: Negative.   Skin: Negative.   Neurological: Negative.   Psychiatric/Behavioral: Positive for hallucinations and memory loss. Negative for depression, substance abuse and suicidal ideas. The patient is nervous/anxious and has insomnia.     Blood pressure (!) 134/94, pulse (!) 110,  temperature 98.7 F (37.1 C), temperature source Oral, resp. rate 18, height 5' 1.02" (1.55 m), weight 99.1 kg, SpO2 100 %.Body mass index is 41.25 kg/m.  General Appearance: Disheveled  Eye Contact:  Minimal  Speech:  Blocked  Volume:  Decreased  Mood:  Dysphoric  Affect:  Flat  Thought Process:  Disorganized  Orientation:  Negative  Thought Content:  Rumination and Tangential  Suicidal Thoughts:  No  Homicidal Thoughts:  No  Memory:  Immediate;   Fair Recent;   Poor Remote;   Poor  Judgement:  Fair  Insight:  Fair  Psychomotor Activity:  Decreased  Concentration:  Concentration: Poor  Recall:  [employer] of Knowledge:  Fair  Language:  Fair  Akathisia:  No  Handed:  Right  AIMS (if indicated):     Assets:  Desire for Improvement Housing Physical Health Resilience Social Support  ADL's:  Impaired  Cognition:  Impaired,  Mild  Sleep:  Number of Hours: 8    Treatment Plan Summary: Daily contact with patient to assess and evaluate symptoms and progress in treatment, Medication management and Plan Patient tells me that she recalls being diagnosed with schizophrenia.  There is a note in the chart that mentions a diagnosis of schizoaffective disorder.  Without more specific details I can't be sure but it certainly sounds like this episode had a lot of mood component.  Today she seems much calmer and more lucid than she was last night but it sounds like she has been in the grip of a mixed episode for many days now.  I suggest that we restart Seroquel which she is agreeable to starting 100 mg at night tonight.  As needed medicine available as well for agitation.  Nicotine patch provided because she normally smokes a pack a day.  Encouraged patient to be involved in groups and assessment here.  I spoke with her mother who repeated essentially the same history that the patient did.  We will certainly be working on arranging discharge plans once she is doing well.  Observation  Level/Precautions:  15 minute checks Patient was placed on one-to-one overnight because of her agitation.  I think we can probably stop that at this point.  Laboratory:  Chemistry Profile  Psychotherapy:    Medications:    Consultations:    Discharge Concerns:    Estimated LOS:  Other:     Physician Treatment Plan for Primary Diagnosis: Schizoaffective disorder (HCC) Long Term Goal(s): Improvement in symptoms so as ready for discharge  Short Term Goals: Ability to demonstrate self-control will improve and Ability to identify and develop effective coping behaviors will improve  Physician Treatment Plan for Secondary Diagnosis: Principal Problem:   Schizoaffective disorder (HCC) Active Problems:   Acute psychosis (HCC)   Nicotine dependence   Pain, dental  Long Term Goal(s): Improvement in symptoms so as ready for discharge  Short Term Goals: Ability to maintain  clinical measurements within normal limits will improve and Compliance with prescribed medications will improve  I certify that inpatient services furnished can reasonably be expected to improve the patient's condition.    Alethia Berthold, MD 12/6/202012:44 PM

## 2018-12-13 DIAGNOSIS — F250 Schizoaffective disorder, bipolar type: Secondary | ICD-10-CM | POA: Diagnosis not present

## 2018-12-13 MED ORDER — OLANZAPINE 5 MG PO TBDP
10.0000 mg | ORAL_TABLET | Freq: Every day | ORAL | Status: DC
  Administered 2018-12-13: 10 mg via ORAL
  Filled 2018-12-13: qty 2

## 2018-12-13 MED ORDER — QUETIAPINE FUMARATE 25 MG PO TABS: 150.0000 mg | ORAL_TABLET | Freq: Every day | ORAL | Status: DC

## 2018-12-13 NOTE — Progress Notes (Signed)
[facility] MD Progress Note  12/13/2018 1:55 PM Rita Ellis  MRN:  [id number] Subjective: Follow-up for this 33 year old woman with psychotic disorder.  Last night she vomited after taking medication.  This morning she tells me she is feeling better.  Chart indicates that she is still endorsing having hallucinations however.  Does not get out of her room very much stays isolated.  When she came to see me today she wanted me to tell her all of the results of her blood tests but then did not have much to say about it afterwards.  Seems a little disorganized in her thinking.  Has not been violent or threatening. Principal Problem: Schizoaffective disorder ([place]) Diagnosis: Principal Problem:   Schizoaffective disorder ([place]) Active Problems:   Acute psychosis (HCC)   Nicotine dependence   Pain, dental  Total Time spent with patient: 30 minutes  Past Psychiatric History: Past history of mental health problems going on for some years no previous hospitalizations  Past Medical History: History reviewed. No pertinent past medical history.  Past Surgical History:  Procedure Laterality Date  . CESAREAN SECTION     Family History: History reviewed. No pertinent family history. Family Psychiatric  History: See previous Social History:  Social History   Substance and Sexual Activity  Alcohol Use Not Currently  . Frequency: Never     Social History   Substance and Sexual Activity  Drug Use Never    Social History   Socioeconomic History  . Marital status: Single    Spouse name: Not on file  . Number of children: Not on file  . Years of education: Not on file  . Highest education level: Not on file  Occupational History  . Not on file  Social Needs  . Financial resource strain: Not on file  . Food insecurity    Worry: Not on file    Inability: Not on file  . Transportation needs    Medical: Not on file    Non-medical: Not on file  Tobacco Use  . Smoking status: Never Smoker  .  Smokeless tobacco: Never Used  Substance and Sexual Activity  . Alcohol use: Not Currently    Frequency: Never  . Drug use: Never  . Sexual activity: Not on file  Lifestyle  . Physical activity    Days per week: Not on file    Minutes per session: Not on file  . Stress: Not on file  Relationships  . Social Herbalist on phone: Not on file    Gets together: Not on file    Attends religious service: Not on file    Active member of club or organization: Not on file    Attends meetings of clubs or organizations: Not on file    Relationship status: Not on file  Other Topics Concern  . Not on file  Social History Narrative  . Not on file   Additional Social History:                         Sleep: Fair  Appetite:  Fair  Current Medications: Current Facility-Administered Medications  Medication Dose Route Frequency Provider Last Rate Last Dose  . acetaminophen (TYLENOL) tablet 650 mg  650 mg Oral Q6H PRN Dixon, Rashaun M, NP      . alum & mag hydroxide-simeth (MAALOX/MYLANTA) 200-200-20 MG/5ML suspension 30 mL  30 mL Oral Q4H PRN Deloria Lair, NP      .  hydrOXYzine (ATARAX/VISTARIL) tablet 50 mg  50 mg Oral TID PRN Clapacs, John T, MD      . ibuprofen (ADVIL) tablet 800 mg  800 mg Oral Q8H PRN Dixon, Rashaun M, NP      . LORazepam (ATIVAN) tablet 2 mg  2 mg Oral Q6H PRN Clapacs, Jackquline Denmark, MD       Or  . LORazepam (ATIVAN) injection 2 mg  2 mg Intramuscular Q6H PRN Clapacs, John T, MD      . magnesium hydroxide (MILK OF MAGNESIA) suspension 30 mL  30 mL Oral Daily PRN Dixon, Rashaun M, NP      . nicotine (NICODERM CQ - dosed in mg/24 hours) patch 21 mg  21 mg Transdermal Daily Clapacs, Jackquline Denmark, MD   21 mg at 12/13/18 0824  . OLANZapine (ZYPREXA) injection 10 mg  10 mg Intramuscular Q6H PRN Clapacs, John T, MD      . OLANZapine zydis (ZYPREXA) disintegrating tablet 10 mg  10 mg Oral QHS Clapacs, John T, MD        Lab Results: No results found for this or any  previous visit (from the past 48 hour(s)).  Blood Alcohol level:  Lab Results  Component Value Date   ETH <10 12/10/2018    Metabolic Disorder Labs: No results found for: HGBA1C, MPG No results found for: PROLACTIN No results found for: CHOL, TRIG, HDL, CHOLHDL, VLDL, LDLCALC  Physical Findings: AIMS:  , ,  ,  ,    CIWA:    COWS:     Musculoskeletal: Strength & Muscle Tone: within normal limits Gait & Station: normal Patient leans: N/A  Psychiatric Specialty Exam: Physical Exam  Nursing note and vitals reviewed. Constitutional: She appears well-developed and well-nourished.  HENT:  Head: Normocephalic and atraumatic.  Eyes: Pupils are equal, round, and reactive to light. Conjunctivae are normal.  Neck: Normal range of motion.  Cardiovascular: Regular rhythm and normal heart sounds.  Respiratory: Effort normal. No respiratory distress.  GI: Soft.  Musculoskeletal: Normal range of motion.  Neurological: She is alert.  Skin: Skin is warm and dry.  Psychiatric: Her affect is blunt. Her speech is delayed. She is slowed. Thought content is paranoid. Cognition and memory are impaired. She expresses impulsivity. She expresses no homicidal and no suicidal ideation.    Review of Systems  Constitutional: Negative.   HENT: Negative.   Eyes: Negative.   Respiratory: Negative.   Cardiovascular: Negative.   Gastrointestinal: Negative.   Musculoskeletal: Negative.   Skin: Negative.   Neurological: Negative.   Psychiatric/Behavioral: Positive for hallucinations. Negative for depression, memory loss, substance abuse and suicidal ideas. The patient is nervous/anxious. The patient does not have insomnia.     Blood pressure 107/65, pulse (!) 121, temperature 98.2 F (36.8 C), temperature source Oral, resp. rate 18, height 5' 1.02" (1.55 m), weight 99.1 kg, SpO2 97 %.Body mass index is 41.25 kg/m.  General Appearance: Casual  Eye Contact:  Minimal  Speech:  Slow  Volume:  Decreased   Mood:  Dysphoric  Affect:  Congruent  Thought Process:  Disorganized  Orientation:  Full (Time, Place, and Person)  Thought Content:  Illogical and Hallucinations: Auditory  Suicidal Thoughts:  No  Homicidal Thoughts:  No  Memory:  Immediate;   Fair Recent;   Fair Remote;   Fair  Judgement:  Fair  Insight:  Fair  Psychomotor Activity:  Decreased  Concentration:  Concentration: Fair  Recall:  [employer] of Knowledge:  Fair  Language:  Fair  Akathisia:  NA  Handed:  Right  AIMS (if indicated):     Assets:  Desire for Improvement Housing  ADL's:  Intact  Cognition:  WNL  Sleep:  Number of Hours: 8     Treatment Plan Summary: Daily contact with patient to assess and evaluate symptoms and progress in treatment, Medication management and Plan Since she had trouble swallowing pills last night and threw up anyway I am going to change her antipsychotic to Zydis which is an oral dissolving tablet that does not require the ability to swallow a pill.  Encourage patient to attend groups get out of her room be more interactive.  We will recheck blood sugar or rather get a hemoglobin A1c since her blood sugar was slightly elevated on admission.  Mordecai RasmussenJohn Clapacs, MD 12/13/2018, 1:55 PM

## 2018-12-13 NOTE — [facility] Group Notes (Signed)
LCSW Group Therapy Note   12/13/2018 11:17 AM  Type of Therapy and Topic:  Group Therapy:  Overcoming Obstacles   Participation Level:  Active   Description of Group:    In this group patients will be encouraged to explore what they see as obstacles to their own wellness and recovery. They will be guided to discuss their thoughts, feelings, and behaviors related to these obstacles. The group will process together ways to cope with barriers, with attention given to specific choices patients can make. Each patient will be challenged to identify changes they are motivated to make in order to overcome their obstacles. This group will be process-oriented, with patients participating in exploration of their own experiences as well as giving and receiving support and challenge from other group members.   Therapeutic Goals: 1. Patient will identify personal and current obstacles as they relate to admission. 2. Patient will identify barriers that currently interfere with their wellness or overcoming obstacles.  3. Patient will identify feelings, thought process and behaviors related to these barriers. 4. Patient will identify two changes they are willing to make to overcome these obstacles:      Summary of Patient Progress Patient was presnt and an active participant in group. Patient shared that an obstacle that has prevented her from reaching her goals was "children".  Pt elaborated that having children young and limited support with kids at times has been problematic for her.     Therapeutic Modalities:   Cognitive Behavioral Therapy Solution Focused Therapy Motivational Interviewing Relapse Prevention Therapy  Assunta Curtis, MSW, LCSW 12/13/2018 11:17 AM

## 2018-12-13 NOTE — Plan of Care (Addendum)
D- Patient alert and oriented. Patient presented in a pleasant mood on assessment stating that she slept ok last night and had no complaints or concerns to voice to this Probation officer. Patient is slightly preoccupied with her lab work, wanting to speak with the doctor. Patient denied depression, stating "I'm feeling blessed". Patient does endorse some anxiety, stating that she doesn't know what's making her feel this way. Patient also denied SI, HI, AVH, and pain at this time. Patient's goal for today is to "talk to my family on the phone".  A- Scheduled medications administered to patient, per MD orders. Support and encouragement provided.  Routine safety checks conducted every 15 minutes.  Patient informed to notify staff with problems or concerns.  R- No adverse drug reactions noted. Patient contracts for safety at this time. Patient compliant with medications and treatment plan. Patient receptive, calm, and cooperative. Patient interacts well with others on the unit.  Patient remains safe at this time.  Problem: Education: Goal: Knowledge of [facility] Education information/materials will improve Outcome: Progressing Goal: Emotional status will improve Outcome: Progressing Goal: Mental status will improve Outcome: Progressing Goal: Verbalization of understanding the information provided will improve Outcome: Progressing

## 2018-12-13 NOTE — Plan of Care (Signed)
  Problem: Education: Goal: Knowledge of [facility] Education information/materials will improve Outcome: Not Progressing Goal: Emotional status will improve Outcome: Not Progressing Goal: Mental status will improve Outcome: Not Progressing Goal: Verbalization of understanding the information provided will improve Outcome: Not Progressing  D: Patient has been isolative to room. When given nighttime seroquel she refused because she said she could not swallow pills. I asked if she could take it crushed in applesauce and she agreed. Patient took the medication and then was seen vomiting into a trashcan in the dayroom about fifteen minutes later. Mood is sad. Affect is flat. Continues to hear voices that tell her things about her kids. Denies SI A: Continue to monitor for safety R: Safety maintained

## 2018-12-13 NOTE — Progress Notes (Signed)
Recreation Therapy Notes  Date: 12/13/2018  Time: 9:30 am   Location: Craft room   Behavioral response: N/A   Intervention Topic: Time Management   Discussion/Intervention: Patient did not attend group.   Clinical Observations/Feedback:  Patient did not attend group.   Dio Giller LRT/CTRS         Shawnell Dykes 12/13/2018 10:32 AM

## 2018-12-13 NOTE — Progress Notes (Signed)
D: Patient has been isolative to room. When given nighttime seroquel she refused because she said she could not swallow pills. I asked if she could take it crushed in applesauce and she agreed. Patient took the medication and then was seen vomiting into a trashcan in the dayroom about fifteen minutes later. Mood is sad. Affect is flat. Continues to hear voices that tell her things about her kids. Denies SI A: Continue to monitor for safety R: Safety maintained

## 2018-12-13 NOTE — Progress Notes (Signed)
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 Resources:     [employer]:     Current Use:    If no, Barriers?:    Expressed Interest in [employer] Information:    [employer] of Residence:  Insurance underwriter  Patient Main Form of Transportation: Diplomatic Services operational officer  Patient Strengths:  Being a mom  Patient Identified Areas of Improvement:  Housing and a job  Patient Goal for Hospitalization:  Getting my head right  Current SI (including self-harm):  No  Current HI:  No  Current AVH: No  Staff Intervention Plan: Group Attendance, Collaborate with Interdisciplinary Treatment Team  Consent to Intern Participation: N/A  Rita Ellis 12/13/2018, 2:54 PM

## 2018-12-14 DIAGNOSIS — F250 Schizoaffective disorder, bipolar type: Secondary | ICD-10-CM | POA: Diagnosis not present

## 2018-12-14 LAB — HEMOGLOBIN A1C
Hgb A1c MFr Bld: 5.8 % — ABNORMAL HIGH (ref 4.8–5.6)
Mean Plasma Glucose: 119.76 mg/dL

## 2018-12-14 LAB — HIV ANTIBODY (ROUTINE TESTING W REFLEX): HIV Screen 4th Generation wRfx: NONREACTIVE

## 2018-12-14 NOTE — Plan of Care (Signed)
   D- Patient alert and oriented. Patient presented in a pleasant mood on assessment stating that she slept good last night and with medication. Patient endorsed depression at 5 of 10,  and anxiety at 3 of 10.  Patient states SI sometime and denies HI, AVH. Pt reports blurred vision and a pain level at  3 of 10 located in stomach. Vital signs monitored. Patient's goal  today is to "spend time with family and friend".   A- Scheduled medications administered to patient, per MD orders. Support and encouragement provided.  Routine safety checks conducted every 15 minutes.  Patient informed to notify staff with problems or concerns.  R- No adverse drug reactions noted. Patient contracts for safety at this time. Patient compliant with medications and treatment plan. Patient receptive, calm, and cooperative. Patient interacts well with others on the unit.  Patient remains safe at this time.   Problem: Education: Goal: Knowledge of [facility] Education information/materials will improve Outcome: Progressing Goal: Emotional status will improve Outcome: Progressing Goal: Mental status will improve Outcome: Progressing Goal: Verbalization of understanding the information provided will improve Outcome: Progressing

## 2018-12-14 NOTE — Tx Team (Addendum)
Interdisciplinary Treatment and Diagnostic Plan Update  12/14/2018 Time of Session: 900am [name] MRN: [id number]  Principal Diagnosis: Schizoaffective disorder [facility])  Secondary Diagnoses: Principal Problem:   Schizoaffective disorder ([place]) Active Problems:   Acute psychosis ([place])   Nicotine dependence   Pain, dental   Current Medications:  Current Facility-Administered Medications  Medication Dose Route Frequency Provider Last Rate Last Dose  . acetaminophen (TYLENOL) tablet 650 mg  650 mg Oral Q6H PRN Dixon, Rashaun M, NP      . alum & mag hydroxide-simeth (MAALOX/MYLANTA) 200-200-20 MG/5ML suspension 30 mL  30 mL Oral Q4H PRN Dixon, Rashaun M, NP   30 mL at 12/14/18 0618  . hydrOXYzine (ATARAX/VISTARIL) tablet 50 mg  50 mg Oral TID PRN Clapacs, John T, MD      . ibuprofen (ADVIL) tablet 800 mg  800 mg Oral Q8H PRN Dixon, Rashaun M, NP      . LORazepam (ATIVAN) tablet 2 mg  2 mg Oral Q6H PRN Clapacs, [name], MD       Or  . LORazepam (ATIVAN) injection 2 mg  2 mg Intramuscular Q6H PRN Clapacs, John T, MD      . magnesium hydroxide (MILK OF MAGNESIA) suspension 30 mL  30 mL Oral Daily PRN Dixon, Rashaun M, NP      . nicotine (NICODERM CQ - dosed in mg/24 hours) patch 21 mg  21 mg Transdermal Daily Clapacs, [name], MD   21 mg at 12/14/18 9323  . OLANZapine (ZYPREXA) injection 10 mg  10 mg Intramuscular Q6H PRN Clapacs, John T, MD      . OLANZapine zydis (ZYPREXA) disintegrating tablet 10 mg  10 mg Oral QHS Clapacs, John T, MD   10 mg at 12/13/18 2151   PTA Medications: Medications Prior to Admission  Medication Sig Dispense Refill Last Dose  . famotidine (PEPCID) 20 MG tablet Take 1 tablet (20 mg total) by mouth 2 (two) times daily. 60 tablet 0   . ibuprofen (ADVIL,MOTRIN) 800 MG tablet Take 1 tablet (800 mg total) by mouth every 8 (eight) hours as needed for moderate pain. (Patient not taking: Reported on 12/11/2018) 15 tablet 0   . traMADol (ULTRAM) 50 MG tablet Take 1 tablet  (50 mg total) by mouth every 6 (six) hours as needed for moderate pain. (Patient not taking: Reported on 12/11/2018) 12 tablet 0     Patient Stressors: Financial difficulties Health problems  Patient Strengths: Active sense of humor Supportive family/friends  Treatment Modalities: Medication Management, Group therapy, Case management,  1 to 1 session with clinician, Psychoeducation, Recreational therapy.   Physician Treatment Plan for Primary Diagnosis: Schizoaffective disorder ([place]) Long Term Goal(s): Improvement in symptoms so as ready for discharge Improvement in symptoms so as ready for discharge   Short Term Goals: Ability to demonstrate self-control will improve Ability to identify and develop effective coping behaviors will improve Ability to maintain clinical measurements within normal limits will improve Compliance with prescribed medications will improve  Medication Management: Evaluate patient's response, side effects, and tolerance of medication regimen.  Therapeutic Interventions: 1 to 1 sessions, Unit Group sessions and Medication administration.  Evaluation of Outcomes: Progressing  Physician Treatment Plan for Secondary Diagnosis: Principal Problem:   Schizoaffective disorder ([place]) Active Problems:   Acute psychosis ([place])   Nicotine dependence   Pain, dental  Long Term Goal(s): Improvement in symptoms so as ready for discharge Improvement in symptoms so as ready for discharge   Short Term Goals: Ability to  demonstrate self-control will improve Ability to identify and develop effective coping behaviors will improve Ability to maintain clinical measurements within normal limits will improve Compliance with prescribed medications will improve     Medication Management: Evaluate patient's response, side effects, and tolerance of medication regimen.  Therapeutic Interventions: 1 to 1 sessions, Unit Group sessions and Medication administration.  Evaluation of  Outcomes: Progressing   RN Treatment Plan for Primary Diagnosis: Schizoaffective disorder ([facility]) Long Term Goal(s): Knowledge of disease and therapeutic regimen to maintain health will improve  Short Term Goals: Ability to demonstrate self-control, Ability to participate in decision making will improve, Ability to verbalize feelings will improve, Ability to identify and develop effective coping behaviors will improve and Compliance with prescribed medications will improve  Medication Management: RN will administer medications as ordered by provider, will assess and evaluate patient's response and provide education to patient for prescribed medication. RN will report any adverse and/or side effects to prescribing provider.  Therapeutic Interventions: 1 on 1 counseling sessions, Psychoeducation, Medication administration, Evaluate responses to treatment, Monitor vital signs and CBGs as ordered, Perform/monitor CIWA, COWS, AIMS and Fall Risk screenings as ordered, Perform wound care treatments as ordered.  Evaluation of Outcomes: Progressing   LCSW Treatment Plan for Primary Diagnosis: Schizoaffective disorder ([facility]) Long Term Goal(s): Safe transition to appropriate next level of care at discharge, Engage patient in therapeutic group addressing interpersonal concerns.  Short Term Goals: Engage patient in aftercare planning with referrals and resources and Increase skills for wellness and recovery  Therapeutic Interventions: Assess for all discharge needs, 1 to 1 time with Social worker, Explore available resources and support systems, Assess for adequacy in community support network, Educate family and significant other(s) on suicide prevention, Complete Psychosocial Assessment, Interpersonal group therapy.  Evaluation of Outcomes: Progressing   Progress in Treatment: Attending groups: No. Participating in groups: No. Taking medication as prescribed: Yes. Toleration medication:  Yes. Family/Significant other contact made: No, will contact:  when consent given Patient understands diagnosis: Yes. Discussing patient identified problems/goals with staff: Yes. Medical problems stabilized or resolved: Yes. Denies suicidal/homicidal ideation: Yes. Issues/concerns per patient self-inventory: No. Other: N/A  New problem(s) identified: No, Describe:  none  New Short Term/Long Term Goal(s): Detox, elimination of AVH/symptoms of psychosis, medication management for mood stabilization; elimination of SI thoughts; development of comprehensive mental wellness/sobriety plan.   Patient Goals:  "Get transportation and get my license back"  Discharge Plan or Barriers: SPE pamphlet, Mobile Crisis information, and AA/NA information provided to patient for additional community support and resources. Pt has a zoom meeting with [facility] on 12/20/2018 at 7am.  Reason for Continuation of Hospitalization: Medication stabilization  Estimated Length of Stay: 1-2 days  Recreational Therapy: Patient: N/A Patient Goal: Patient will engage in groups without prompting or encouragement from LRT x3 group sessions within 5 recreation therapy group sessions  Attendees: Patient: Rita Ellis 12/14/2018 10:13 AM  Physician: Dr Toni Amend MD 12/14/2018 10:13 AM  Nursing: Doyce Para RN 12/14/2018 10:13 AM  RN Care Manager: 12/14/2018 10:13 AM  Social Worker: Zollie Scale Moton LCSW 12/14/2018 10:13 AM  Recreational Therapist: Garret Reddish CTRS LRT 12/14/2018 10:13 AM  Other: Penni Homans LCSW  12/14/2018 10:13 AM  Other:  12/14/2018 10:13 AM  Other: 12/14/2018 10:13 AM    Scribe for Treatment Team: Charlann Lange Moton, LCSW 12/14/2018 10:13 AM

## 2018-12-14 NOTE — [facility] Group Notes (Signed)
  LCSW Group Therapy Note  12/14/2018 11:38 AM   Type of Therapy/Topic:  Group Therapy:  Feelings about Diagnosis  Participation Level:  Active   Description of Group:   This group will allow patients to explore their thoughts and feelings about diagnoses they have received. Patients will be guided to explore their level of understanding and acceptance of these diagnoses. Facilitator will encourage patients to process their thoughts and feelings about the reactions of others to their diagnosis and will guide patients in identifying ways to discuss their diagnosis with significant others in their lives. This group will be process-oriented, with patients participating in exploration of their own experiences, giving and receiving support, and processing challenge from other group members.   Therapeutic Goals: 1. Patient will demonstrate understanding of diagnosis as evidenced by identifying two or more symptoms of the disorder 2. Patient will be able to express two feelings regarding the diagnosis 3. Patient will demonstrate their ability to communicate their needs through discussion and/or role play  Summary of Patient Progress: Pt was appropriate and respectful in group. Pt was able to identify a diagnosis as "a disorder". Pt reported that she is diagnosed with PTSD and schizoaffective disorder. Pt reported that it is sad and scary for her knowing her diagnosis because she doesn't fully understand it. Pt reported that she hears voices and sees things sometimes. Pt reported that she was scared because she was hearing footsteps and no one else heard them. Pt reported plans to apply for SSDI. Pt was open to talking about her diagnoses and asking questions related to them.   Therapeutic Modalities:   Cognitive Behavioral Therapy Brief Therapy Feelings Identification    [name], MSW, LCSW Clinical Social Work 12/14/2018 11:38 AM

## 2018-12-14 NOTE — Progress Notes (Signed)
[place] NOVEL CORONAVIRUS (COVID-19) DAILY CHECK-OFF SYMPTOMS - answer yes or no to each - every day NO YES  Have you had a fever in the past 24 hours?  . Fever (Temp > 37.80C / 100F) X   Have you had any of these symptoms in the past 24 hours? . New Cough .  Sore Throat  .  Shortness of Breath .  Difficulty Breathing .  Unexplained Body Aches   X   Have you had any one of these symptoms in the past 24 hours not related to allergies?   . Runny Nose .  Nasal Congestion .  Sneezing   X   If you have had runny nose, nasal congestion, sneezing in the past 24 hours, has it worsened?  X   EXPOSURES - check yes or no X   Have you traveled outside the state in the past 14 days?  X   Have you been in contact with someone with a confirmed diagnosis of COVID-19 or PUI in the past 14 days without wearing appropriate PPE?  X   Have you been living in the same home as a person with confirmed diagnosis of COVID-19 or a PUI (household contact)?    X   Have you been diagnosed with COVID-19?    X              What to do next: Answered NO to all: Answered YES to anything:   Proceed with unit schedule Follow the [facility] Inpatient Flowsheet.   

## 2018-12-14 NOTE — Progress Notes (Addendum)
Patient was in her room upon arrival to the unit. Patient was pleasant during assessment denying SI/HI/AVH, pain, anxiety and depression with this Probation officer. Patient compliant with medication administration per MD orders. Patient observed interacting appropriately with staff and peers on the unit. Patient given education. Patient given support and encouragement to be active in her treatment plan. Patient stated to this writer that she was feeling better and is hopeful she can leave soon. Patient being monitored Q 15 minutes for safety per unit protocol. Patient remains safe on the unit.

## 2018-12-14 NOTE — Progress Notes (Signed)
Recreation Therapy Notes  Date: 12/14/2018  Time: 9:30 am  Location: Craft room  Behavioral response: Appropriate   Intervention Topic: Problem Solving   Discussion/Intervention:  Group content on today was focused on problem solving. The group described what problem solving is. Patients expressed how problems affect them and how they deal with problems. Individuals identified healthy ways to deal with problems. Patients explained what normally happens to them when they do not deal with problems. The group expressed reoccurring problems for them. The group participated in the intervention "Ways to Solve problems" where patients were given a chance to explore different ways to solve problems.  Clinical Observations/Feedback:  Patient came to group and defined problem solving as listening to what others have to say. She explained that she problems solves by figuring out a strategy and tries to find answers. Participant expressed that problem solving is important to find an answer and to reduce a misunderstanding. Individual was social with peers and staff while participating in group.   Kevin Mario LRT/CTRS         Luca Burston 12/14/2018 11:16 AM

## 2018-12-14 NOTE — Plan of Care (Signed)
Patient stated she was feeling better and is hopeful she can D/C soon  Problem: Education: Goal: Emotional status will improve Outcome: Progressing Goal: Mental status will improve Outcome: Progressing

## 2018-12-14 NOTE — Progress Notes (Signed)
   12/14/18 2300  Psych Admission Type (Psych Patients Only)  Admission Status Involuntary  Psychosocial Assessment  Patient Complaints None  Eye Contact Fair  Speech Logical/coherent  Interaction Assertive  Motor Activity Slow  Appearance/Hygiene Unremarkable  Behavior Characteristics Cooperative  Mood Pleasant  Thought Process  Coherency WDL  Content WDL  Delusions None reported or observed  Perception WDL  Hallucination None reported or observed  Judgment WDL  Confusion None  Danger to Self  Current suicidal ideation? Denies  Danger to Others  Danger to Others None reported or observed   Pt in dayroom interacting with another pt. Pt denies any issues. Pt refuses meds this evening.

## 2018-12-14 NOTE — Progress Notes (Signed)
Rita Ellis requested prayer. Upon arrival Rita Ellis was lying in bed resting (due to lateness of visit). She was open for a visit and openly talked to chaplain with a smile. Pleasant, cooperative, and verbally thankful for care. Rita Ellis expressed being overwhelmed with her recent thoughts and experiences. She desired prayer for these experiences to stop and leave her body. She expressed concern for all those around her including her family and those with her in the hospital. Prayer was offered for assurance, comfort, and peace. Chaplain offered Rita Ellis of assurance regarding her original goodness. Rita Ellis spoke of her faith as she attends church when she has transportation. She desires additional spiritual support as she experiences out of control feelings. Chaplain will hand off spiritual care request to chaplain on-call on 12/9.    12/14/18 2000  Clinical Encounter Type  Visited With Patient  Visit Type Initial;Behavioral Health;Spiritual support  Referral From Patient;Nurse  Spiritual Encounters  Spiritual Needs Prayer;Emotional  Stress Factors  Patient Stress Factors Health changes;Loss of control

## 2018-12-14 NOTE — Progress Notes (Signed)
[facility]  12/14/2018 12:19 PM Rita Ellis  MRN:  [id number]    Subjective: Patient seen and chart reviewed.  33 year old female who presented to the emergency room 2 days ago with agitation, confusion, and bizarre behaviors.  Patient assessed today appears appears to be alert and oriented, calm and cooperative.  She does endorse feeling much better since coming to the hospital.  She describes her initial presentation as out of whack.  She is observed to be on the phone prior to the evaluation and appears to be lucid and having appropriate conversation.  Patient reports improvement in her auditory hallucinations and grandiosities.  She does state that was her goal was to overcome the hallucinations which she obtained.  She states her goal today was to talk to the chaplain and request for prayer.  She does endorse decreased appetite and anxiety.  She reports her decrease of appetite is likely due to improper eating habits that has resulted in her vomiting.  She reports her increase in anxiety is most likely due to some paranoia from the fire alarms and the duress buttons going off last night.  Patient reports upon discharge she plans to return home where she resides with her mother and 2 children.  At the time of this evaluation she does deny suicidal ideations, homicidal ideations, and or auditory visual hallucinations.    Principal Problem: Schizoaffective disorder (HCC) Diagnosis: Principal Problem:   Schizoaffective disorder (HCC) Active Problems:   Acute psychosis (HCC)   Nicotine dependence   Pain, dental  Total Time spent with patient: 30 minutes  Past Psychiatric History: Past history of mental health problems going on for some years no previous hospitalizations  Past Medical History: History reviewed. No pertinent past medical history.  Past Surgical History:  Procedure Laterality Date  . CESAREAN SECTION     Family History: History reviewed. No pertinent family  history. Family Psychiatric  History: See previous Social History:  Social History   Substance and Sexual Activity  Alcohol Use Not Currently  . Frequency: Never     Social History   Substance and Sexual Activity  Drug Use Never    Social History   Socioeconomic History  . Marital status: Single    Spouse name: Not on file  . Number of children: Not on file  . Years of education: Not on file  . Highest education level: Not on file  Occupational History  . Not on file  Social Needs  . Financial resource strain: Not on file  . Food insecurity    Worry: Not on file    Inability: Not on file  . Transportation needs    Medical: Not on file    Non-medical: Not on file  Tobacco Use  . Smoking status: Never Smoker  . Smokeless tobacco: Never Used  Substance and Sexual Activity  . Alcohol use: Not Currently    Frequency: Never  . Drug use: Never  . Sexual activity: Not on file  Lifestyle  . Physical activity    Days per week: Not on file    Minutes per session: Not on file  . Stress: Not on file  Relationships  . Social Musicianconnections    Talks on phone: Not on file    Gets together: Not on file    Attends religious service: Not on file    Active member of club or organization: Not on file    Attends meetings of clubs or organizations: Not on file  Relationship status: Not on file  Other Topics Concern  . Not on file  Social History Narrative  . Not on file   Additional Social History:    Sleep: Fair  Appetite:  Fair  Current Medications: Current Facility-Administered Medications  Medication Dose Route Frequency Provider Last Rate Last Dose  . acetaminophen (TYLENOL) tablet 650 mg  650 mg Oral Q6H PRN Dixon, Rashaun M, NP      . alum & mag hydroxide-simeth (MAALOX/MYLANTA) 200-200-20 MG/5ML suspension 30 mL  30 mL Oral Q4H PRN Dixon, Rashaun M, NP   30 mL at 12/14/18 0618  . hydrOXYzine (ATARAX/VISTARIL) tablet 50 mg  50 mg Oral TID PRN Clapacs, John T, MD       . ibuprofen (ADVIL) tablet 800 mg  800 mg Oral Q8H PRN Dixon, Rashaun M, NP      . LORazepam (ATIVAN) tablet 2 mg  2 mg Oral Q6H PRN Clapacs, Jackquline Denmark, MD       Or  . LORazepam (ATIVAN) injection 2 mg  2 mg Intramuscular Q6H PRN Clapacs, John T, MD      . magnesium hydroxide (MILK OF MAGNESIA) suspension 30 mL  30 mL Oral Daily PRN Dixon, Rashaun M, NP      . nicotine (NICODERM CQ - dosed in mg/24 hours) patch 21 mg  21 mg Transdermal Daily Clapacs, Jackquline Denmark, MD   21 mg at 12/14/18 3748  . OLANZapine (ZYPREXA) injection 10 mg  10 mg Intramuscular Q6H PRN Clapacs, John T, MD      . OLANZapine zydis (ZYPREXA) disintegrating tablet 10 mg  10 mg Oral QHS Clapacs, John T, MD   10 mg at 12/13/18 2151    Lab Results: No results found for this or any previous visit (from the past 48 hour(s)).  Blood Alcohol level:  Lab Results  Component Value Date   ETH <10 12/10/2018    Metabolic Disorder Labs: No results found for: HGBA1C, MPG No results found for: PROLACTIN No results found for: CHOL, TRIG, HDL, CHOLHDL, VLDL, LDLCALC  Physical Findings: AIMS:  , ,  ,  ,    CIWA:    COWS:     Musculoskeletal: Strength & Muscle Tone: within normal limits Gait & Station: normal Patient leans: N/A  Psychiatric Specialty Exam: Physical Exam  Nursing note and vitals reviewed. Constitutional: She appears well-developed and well-nourished.  HENT:  Head: Normocephalic and atraumatic.  Eyes: Pupils are equal, round, and reactive to light. Conjunctivae are normal.  Neck: Normal range of motion.  Cardiovascular: Regular rhythm and normal heart sounds.  Respiratory: Effort normal. No respiratory distress.  GI: Soft.  Musculoskeletal: Normal range of motion.  Neurological: She is alert.  Skin: Skin is warm and dry.  Psychiatric: Her affect is blunt. Her speech is delayed. She is slowed. Thought content is paranoid. Cognition and memory are impaired. She expresses impulsivity. She expresses no homicidal  and no suicidal ideation.    Review of Systems  Constitutional: Negative.   HENT: Negative.   Eyes: Negative.   Respiratory: Negative.   Cardiovascular: Negative.   Gastrointestinal: Negative.   Musculoskeletal: Negative.   Skin: Negative.   Neurological: Negative.   Psychiatric/Behavioral: Positive for hallucinations. Negative for depression, memory loss, substance abuse and suicidal ideas. The patient is nervous/anxious. The patient does not have insomnia.     Blood pressure 127/72, pulse (!) 101, temperature 98.7 F (37.1 C), temperature source Oral, resp. rate 18, height 5' 1.02" (1.55 m), weight 99.1 kg,  SpO2 100 %.Body mass index is 41.25 kg/m.  General Appearance: Fairly Groomed  Eye Contact:  Fair  Speech:  Clear and Coherent and Normal Rate  Volume:  Normal  Mood:  improving  Affect:  Appropriate and Congruent  Thought Process:  Coherent and Descriptions of Associations: Intact  Orientation:  Full (Time, Place, and Person)  Thought Content:  Logical and Hallucinations are improving.  Suicidal Thoughts:  No  Homicidal Thoughts:  No  Memory:  Immediate;   Fair Recent;   Fair Remote;   Fair  Judgement:  Fair  Insight:  Fair  Psychomotor Activity:  Decreased  Concentration:  Concentration: Fair  Recall:  [employer] of Knowledge:  Fair  Language:  Fair  Akathisia:  NA  Handed:  Right  AIMS (if indicated):     Assets:  Desire for Improvement Housing  ADL's:  Intact  Cognition:  WNL  Sleep:  Number of Hours: 2.15     Treatment Plan Summary: Daily contact with patient to assess and evaluate symptoms and progress in treatment and Medication management Patient reports improvement with symptoms.  She denies any difficulty with her medication.  Will continue Zyprexa Zydis at this time.  A1c is 5.8.  Lipid panel is not yet available, unable to add onto previous labs.  Will need to obtain lipid panel if patient continues with antipsychotic therapy.  We will begin to  prepare for discharge planning at this time. Suella Broad, FNP 12/14/2018, 12:19 PM

## 2018-12-15 DIAGNOSIS — F250 Schizoaffective disorder, bipolar type: Secondary | ICD-10-CM | POA: Diagnosis not present

## 2018-12-15 NOTE — Progress Notes (Signed)
[place] NOVEL CORONAVIRUS (COVID-19) DAILY CHECK-OFF SYMPTOMS - answer yes or no to each - every day NO YES  Have you had a fever in the past 24 hours?  . Fever (Temp > 37.80C / 100F) X   Have you had any of these symptoms in the past 24 hours? . New Cough .  Sore Throat  .  Shortness of Breath .  Difficulty Breathing .  Unexplained Body Aches   X   Have you had any one of these symptoms in the past 24 hours not related to allergies?   . Runny Nose .  Nasal Congestion .  Sneezing   X   If you have had runny nose, nasal congestion, sneezing in the past 24 hours, has it worsened?  X   EXPOSURES - check yes or no X   Have you traveled outside the state in the past 14 days?  X   Have you been in contact with someone with a confirmed diagnosis of COVID-19 or PUI in the past 14 days without wearing appropriate PPE?  X   Have you been living in the same home as a person with confirmed diagnosis of COVID-19 or a PUI (household contact)?    X   Have you been diagnosed with COVID-19?    X              What to do next: Answered NO to all: Answered YES to anything:   Proceed with unit schedule Follow the [facility] Inpatient Flowsheet.   

## 2018-12-15 NOTE — Progress Notes (Addendum)
Recreation Therapy Notes  Date: 12/15/2018  Time: 9:30 am  Location: Craft room  Behavioral response: Appropriate   Intervention Topic: Stress  Discussion/Intervention:  Group content on today was focused on stress. The group defined stress and way to cope with stress. Participants expressed how they know when they are stresses out. Individuals described the different ways they have to cope with stress. The group stated reasons why it is important to cope with stress. Patient explained what good stress is and some examples. The group participated in the intervention "Stress Management". Individuals were separated into two group and answered questions related to stress.  Clinical Observations/Feedback:  Patient came to group and defined stress as being overwhelmed. She stated that she deals with stress by listening to music, eat and exercise. Participant explained that if stress is not dealt with it can decline your health. Individual was social with peers and staff while participating in group.   Cherylann Hobday LRT/CTRS         Lailee Hoelzel 12/15/2018 11:53 AM

## 2018-12-15 NOTE — Plan of Care (Signed)
  D- Patient alert and oriented X 4. Patient presents in a pleasant mood on assessment and stating that she slept "fair" last night  denies using medication to sleep.  Appetite and energy levels reported as "good". Patient endorsed depression at 4 of 10 and anxiety at 6 of 10. She reports no pain today.   Patient states she is feeling better but continue to have blurred vision.  Patient denies SI, HI, and AVH at this time. Patient reports  having goals having wounds completely healed and going home.    A- Scheduled medications administered to patient, per MD orders. Support and encouragement provided.  Routine safety checks conducted every 15 minutes.  Patient agreed to notify staff with problems or concerns.   R- No adverse reactions to drugs noted. Patient contracts for safety at this time. Patient compliant with medications and treatment plan. Patient receptive, calm, and cooperative. Patient interacts well with others on the unit.  Patient remains safe at this time   Problem: Education: Goal: Knowledge of [facility] Education information/materials will improve Outcome: Progressing Goal: Emotional status will improve Outcome: Progressing Goal: Mental status will improve Outcome: Progressing Goal: Verbalization of understanding the information provided will improve Outcome: Progressing

## 2018-12-15 NOTE — [facility] Group Notes (Signed)
LCSW Group Therapy Note  12/15/2018 1:00 PM  Type of Therapy/Topic:  Group Therapy:  Emotion Regulation  Participation Level:  Active   Description of Group:   The purpose of this group is to assist patients in learning to regulate negative emotions and experience positive emotions. Patients will be guided to discuss ways in which they have been vulnerable to their negative emotions. These vulnerabilities will be juxtaposed with experiences of positive emotions or situations, and patients will be challenged to use positive emotions to combat negative ones. Special emphasis will be placed on coping with negative emotions in conflict situations, and patients will process healthy conflict resolution skills.  Therapeutic Goals: 1. Patient will identify two positive emotions or experiences to reflect on in order to balance out negative emotions 2. Patient will label two or more emotions that they find the most difficult to experience 3. Patient will demonstrate positive conflict resolution skills through discussion and/or role plays  Summary of Patient Progress: Patient was present in group.  Patient appeared to be writing and drawing throughout group, however, patient was able to remain on topic and engage in discussion.  Patient reports that she uses music and collecting items as are coping skills when she feels overwhelmed.    Therapeutic Modalities:   Cognitive Behavioral Therapy Feelings Identification Dialectical Behavioral Therapy  Assunta Curtis, MSW, LCSW 12/15/2018 11:42 AM

## 2018-12-15 NOTE — Plan of Care (Deleted)
  D- Patient alert and oriented X 4. Patient presents in a pleasant mood on assessment and stating that she slept "fair" last night  denies using medication to sleep.  Appetite and energy levels reported as "good". Patient endorsed depression at 4 of 10 and anxiety at 3 of 10. She reports no pain today.   Patient states she is feeling well after 2nd session of ECT.  Patient denies SI, HI, and AVH at this time. Patient reports  having goals having wounds completely healed and going home.    A- Scheduled medications administered to patient, per MD orders. Support and encouragement provided.  Routine safety checks conducted every 15 minutes.  Patient agreed to notify staff with problems or concerns.   R- No adverse reactions to ECT or drugs noted. Patient contracts for safety at this time. Patient compliant with medications and treatment plan. Patient receptive, calm, and cooperative. Patient interacts well with others on the unit.  Patient remains safe at this time.   Problem: Education: Goal: Knowledge of  General Education information/materials will improve Outcome: Progressing Goal: Emotional status will improve Outcome: Progressing Goal: Mental status will improve Outcome: Progressing Goal: Verbalization of understanding the information provided will improve Outcome: Progressing
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
## 2018-12-15 NOTE — Progress Notes (Signed)
[facility] MD Progress Note  12/15/2018 1:19 PM Rita Ellis  MRN:  [id number] Subjective: Patient seen and chart reviewed.  Patient is neatly dressed and very appropriate today.  Makes good eye contact.  Asks reasonable questions.  She was pleased to learn that her HIV test was negative.  Patient reports having no hallucinations.  She seems to be lucid and clear in her thinking.  This is very interesting in part because as far as I know she has not actually received any antipsychotic since coming into the hospital.  She apparently "refused" the Zyprexa last night and was still complaining of nausea.  On interview today she seems clear in her thinking and is able to articulate a reasonable plan for the future. Principal Problem: Schizoaffective disorder ([place]) Diagnosis: Principal Problem:   Schizoaffective disorder ([place]) Active Problems:   Acute psychosis ([place])   Nicotine dependence   Pain, dental  Total Time spent with patient: 30 minutes  Past Psychiatric History: Patient has had previous visits in the past and has had symptoms previously.  Has been given a diagnosis of schizophrenia at least one time in the past but seems not to a followed up with outpatient treatment  Past Medical History: History reviewed. No pertinent past medical history.  Past Surgical History:  Procedure Laterality Date  . CESAREAN SECTION     Family History: History reviewed. No pertinent family history. Family Psychiatric  History: See previous Social History:  Social History   Substance and Sexual Activity  Alcohol Use Not Currently  . Frequency: Never     Social History   Substance and Sexual Activity  Drug Use Never    Social History   Socioeconomic History  . Marital status: Single    Spouse name: Not on file  . Number of children: Not on file  . Years of education: Not on file  . Highest education level: Not on file  Occupational History  . Not on file  Social Needs  . Financial resource  strain: Not on file  . Food insecurity    Worry: Not on file    Inability: Not on file  . Transportation needs    Medical: Not on file    Non-medical: Not on file  Tobacco Use  . Smoking status: Never Smoker  . Smokeless tobacco: Never Used  Substance and Sexual Activity  . Alcohol use: Not Currently    Frequency: Never  . Drug use: Never  . Sexual activity: Not on file  Lifestyle  . Physical activity    Days per week: Not on file    Minutes per session: Not on file  . Stress: Not on file  Relationships  . Social Herbalist on phone: Not on file    Gets together: Not on file    Attends religious service: Not on file    Active member of club or organization: Not on file    Attends meetings of clubs or organizations: Not on file    Relationship status: Not on file  Other Topics Concern  . Not on file  Social History Narrative  . Not on file   Additional Social History:                         Sleep: Fair  Appetite:  Fair  Current Medications: Current Facility-Administered Medications  Medication Dose Route Frequency Provider Last Rate Last Dose  . acetaminophen (TYLENOL) tablet 650 mg  650  mg Oral Q6H PRN Jearld Lesch, NP      . alum & mag hydroxide-simeth (MAALOX/MYLANTA) 200-200-20 MG/5ML suspension 30 mL  30 mL Oral Q4H PRN Jearld Lesch, NP   30 mL at 12/14/18 0618  . hydrOXYzine (ATARAX/VISTARIL) tablet 50 mg  50 mg Oral TID PRN Adreyan Carbajal T, MD      . ibuprofen (ADVIL) tablet 800 mg  800 mg Oral Q8H PRN Jearld Lesch, NP   800 mg at 12/14/18 1637  . LORazepam (ATIVAN) tablet 2 mg  2 mg Oral Q6H PRN Ivoree Felmlee, Jackquline Denmark, MD       Or  . LORazepam (ATIVAN) injection 2 mg  2 mg Intramuscular Q6H PRN Nissim Fleischer, Jackquline Denmark, MD   2 mg at 12/15/18 0335  . magnesium hydroxide (MILK OF MAGNESIA) suspension 30 mL  30 mL Oral Daily PRN Dixon, Rashaun M, NP      . nicotine (NICODERM CQ - dosed in mg/24 hours) patch 21 mg  21 mg Transdermal Daily  Nylan Nevel, Jackquline Denmark, MD   21 mg at 12/15/18 0858  . OLANZapine (ZYPREXA) injection 10 mg  10 mg Intramuscular Q6H PRN Darrnell Mangiaracina T, MD      . OLANZapine zydis (ZYPREXA) disintegrating tablet 10 mg  10 mg Oral QHS Nazeer Romney, Jackquline Denmark, MD   10 mg at 12/13/18 2151    Lab Results:  Results for orders placed or performed during the hospital encounter of 12/11/18 (from the past 48 hour(s))  Hemoglobin A1c     Status: Abnormal   Collection Time: 12/14/18  8:16 AM  Result Value Ref Range   Hgb A1c MFr Bld 5.8 (H) 4.8 - 5.6 %    Comment: (NOTE) Pre diabetes:          5.7%-6.4% Diabetes:              >6.4% Glycemic control for   <7.0% adults with diabetes    Mean Plasma Glucose 119.76 mg/dL    Comment: Performed at [facility], [address] [place], [place] [address]  HIV Antibody (routine testing w rflx)     Status: None   Collection Time: 12/14/18  2:15 PM  Result Value Ref Range   HIV Screen 4th Generation wRfx NON REACTIVE NON REACTIVE    Comment: Performed at [facility], [address] [place], [place] [address]    Blood Alcohol level:  Lab Results  Component Value Date   ETH <10 12/10/2018    Metabolic Disorder Labs: Lab Results  Component Value Date   HGBA1C 5.8 (H) 12/14/2018   MPG 119.76 12/14/2018   No results found for: PROLACTIN No results found for: CHOL, TRIG, HDL, CHOLHDL, VLDL, LDLCALC  Physical Findings: AIMS:  , ,  ,  ,    CIWA:    COWS:     Musculoskeletal: Strength & Muscle Tone: within normal limits Gait & Station: normal Patient leans: N/A  Psychiatric Specialty Exam: Physical Exam  Nursing note and vitals reviewed. Constitutional: She appears well-developed and well-nourished.  HENT:  Head: Normocephalic and atraumatic.  Eyes: Pupils are equal, round, and reactive to light. Conjunctivae are normal.  Neck: Normal range of motion.  Cardiovascular: Regular rhythm and normal heart sounds.  Respiratory: Effort normal. No respiratory  distress.  GI: Soft.  Musculoskeletal: Normal range of motion.  Neurological: She is alert.  Skin: Skin is warm and dry.  Psychiatric: She has a normal mood and affect. Her behavior is  normal. Judgment and thought content normal.    Review of Systems  Constitutional: Negative.   HENT: Negative.   Eyes: Negative.   Respiratory: Negative.   Cardiovascular: Negative.   Gastrointestinal: Negative.   Musculoskeletal: Negative.   Skin: Negative.   Neurological: Negative.   Psychiatric/Behavioral: Negative for depression, hallucinations, memory loss, substance abuse and suicidal ideas. The patient is not nervous/anxious and does not have insomnia.     Blood pressure 112/88, pulse (!) 120, temperature 98.3 F (36.8 C), temperature source Oral, resp. rate 16, height 5' 1.02" (1.55 m), weight 99.1 kg, SpO2 100 %.Body mass index is 41.25 kg/m.  General Appearance: Casual  Eye Contact:  Good  Speech:  Normal Rate  Volume:  Normal  Mood:  Euthymic  Affect:  Congruent  Thought Process:  Coherent  Orientation:  Full (Time, Place, and Person)  Thought Content:  Logical  Suicidal Thoughts:  No  Homicidal Thoughts:  No  Memory:  Immediate;   Fair Recent;   Fair Remote;   Fair  Judgement:  Fair  Insight:  Fair  Psychomotor Activity:  Normal  Concentration:  Concentration: Fair  Recall:  [employer]  Fund of Knowledge:  Fair  Language:  Fair  Akathisia:  No  Handed:  Right  AIMS (if indicated):     Assets:  Desire for Improvement Housing Physical Health Resilience Social Support  ADL's:  Intact  Cognition:  WNL  Sleep:  Number of Hours: 4.75     Treatment Plan Summary: Daily contact with patient to assess and evaluate symptoms and progress in treatment, Medication management and Plan Interesting case of a woman who was very agitated with manic-like psychotic behaviors when she first presented but seems to have cleared up after just getting some sleep despite not being on mood  stabilizers or antipsychotics.  Today she presents as basically asymptomatic.  I asked her if she would please consider taking the Zyprexa this evening and she agreed to it.  I talk with her about the differential diagnosis of bipolar disorder versus schizophrenia and the possibility that perhaps this is neither of those.  Patient asked appropriate questions and was not argumentative about it.  She is willing to try the Zydis tonight.  I told her if everything seems to be going well overnight we are likely going to be looking at discharge tomorrow.  She has no complaints and is agreeable to that.  Mordecai RasmussenJohn Uriah Philipson, MD 12/15/2018, 1:19 PM

## 2018-12-16 DIAGNOSIS — F250 Schizoaffective disorder, bipolar type: Secondary | ICD-10-CM | POA: Diagnosis not present

## 2018-12-16 MED ORDER — OLANZAPINE 10 MG PO TBDP: 10.0000 mg | ORAL_TABLET | Freq: Every day | ORAL | 0 refills | Status: DC

## 2018-12-16 MED ORDER — HYDROXYZINE HCL 50 MG PO TABS: 50.0000 mg | ORAL_TABLET | Freq: Three times a day (TID) | ORAL | 1 refills | Status: DC | PRN

## 2018-12-16 MED ORDER — HYDROXYZINE HCL 50 MG PO TABS: 50.0000 mg | ORAL_TABLET | Freq: Three times a day (TID) | ORAL | 0 refills | Status: DC | PRN

## 2018-12-16 MED ORDER — OLANZAPINE 10 MG PO TBDP: 10.0000 mg | ORAL_TABLET | Freq: Every day | ORAL | 1 refills | Status: DC

## 2018-12-16 NOTE — Plan of Care (Addendum)
Pt rates depression and anxiety both at 3/10. Pt denies SI, HI and VH.  Pt says she has AH but mostly positive. Pt was educated on care plan and and verbalizes understanding. Collier Bullock RN Problem: Education: Goal: Knowledge of [facility] Education information/materials will improve Outcome: Adequate for Discharge Goal: Emotional status will improve Outcome: Adequate for Discharge Goal: Mental status will improve Outcome: Adequate for Discharge Goal: Verbalization of understanding the information provided will improve Outcome: Adequate for Discharge

## 2018-12-16 NOTE — Discharge Summary (Signed)
Physician Discharge Summary Note  Patient:  Rita Ellis is an 33 y.o., female MRN:  [id number] DOB:  11-Oct-1985 Patient phone:  [phone] (home)  Patient address:   [address] [place] [address],  Total Time spent with patient: 30 minutes  Date of Admission:  12/11/2018 Date of Discharge: 12/16/18  Reason for Admission:  33 year old woman presented to the emergency room 2 days ago.  Family had called 911 because the patient had been crying for days was agitated and confused.  Police report that the patient was agitated acting bizarrely and appeared to be hallucinating when they brought her to the emergency room.  Last night on admission to the unit patient was described as agitated pounding on people's doors being hypersexual taking her clothing off.   Principal Problem: Schizoaffective disorder [facility]) Discharge Diagnoses: Principal Problem:   Schizoaffective disorder (HCC) Active Problems:   Acute psychosis (HCC)   Nicotine dependence   Pain, dental   Past Psychiatric History: We don't have any written records.  Patient and mother both deny the patient ever having an inpatient hospitalization before.  They report that she has been to see a psychiatrist in the past.  Do not remember the name but remember that it was in [place].  Patient remembers being told that she might have schizophrenia which the mother confirms.  The only medicine the patient recalls is Seroquel.  She denies any history of suicide attempts or violence.  Denies any drug or alcohol abuse problems  Past Medical History: History reviewed. No pertinent past medical history.  Past Surgical History:  Procedure Laterality Date  . CESAREAN SECTION     Family History: History reviewed. No pertinent family history. Family Psychiatric  History: Patient states she thinks that her mother may have depression but there is no known family history of psychosis or bipolar disorder that she can report.  The patient herself  does have a young son who has autistic spectrum disorder. Social History:  Social History   Substance and Sexual Activity  Alcohol Use Not Currently     Social History   Substance and Sexual Activity  Drug Use Never    Social History   Socioeconomic History  . Marital status: Single    Spouse name: Not on file  . Number of children: Not on file  . Years of education: Not on file  . Highest education level: Not on file  Occupational History  . Not on file  Tobacco Use  . Smoking status: Never Smoker  . Smokeless tobacco: Never Used  Substance and Sexual Activity  . Alcohol use: Not Currently  . Drug use: Never  . Sexual activity: Not on file  Other Topics Concern  . Not on file  Social History Narrative  . Not on file   Social Determinants of Health   Financial Resource Strain:   . Difficulty of Paying Living Expenses: Not on file  Food Insecurity:   . Worried About Programme researcher, broadcasting/film/video in the Last Year: Not on file  . Ran Out of Food in the Last Year: Not on file  Transportation Needs:   . Lack of Transportation (Medical): Not on file  . Lack of Transportation (Non-Medical): Not on file  Physical Activity:   . Days of Exercise per Week: Not on file  . Minutes of Exercise per Session: Not on file  Stress:   . Feeling of Stress : Not on file  Social Connections:   . Frequency of Communication  with Friends and Family: Not on file  . Frequency of Social Gatherings with Friends and Family: Not on file  . Attends Religious Services: Not on file  . Active Member of Clubs or Organizations: Not on file  . Attends Archivist Meetings: Not on file  . Marital Status: Not on file    Hospital Course:  Patient remained on the [facility] unit for 4 days. The patient stabilized on medication and therapy. Patient was discharged on Zyprexa Zydis 10 mg QHS and Vistaril 25 mg TID PRN. Patient has shown improvement with improved mood, affect, sleep, appetite, and interaction.  Patient has attended group and participated. Patient has been seen in the day room interacting with peers and staff appropriately. Patient denies any SI/HI/AVH and contracts for safety. Patient agrees to follow up at [facility]. Patient is provided with prescriptions for their medications upon discharge.  Physical Findings: AIMS:  , ,  ,  ,    CIWA:    COWS:     Musculoskeletal: Strength & Muscle Tone: within normal limits Gait & Station: normal Patient leans: N/A  Psychiatric Specialty Exam: Physical Exam  Nursing note and vitals reviewed. Constitutional: She is oriented to person, place, and time. She appears well-developed and well-nourished.  Cardiovascular: Normal rate.  Respiratory: Effort normal.  Musculoskeletal:        General: Normal range of motion.  Neurological: She is alert and oriented to person, place, and time.  Skin: Skin is warm.    Review of Systems  Constitutional: Negative.   HENT: Negative.   Eyes: Negative.   Respiratory: Negative.   Cardiovascular: Negative.   Gastrointestinal: Negative.   Genitourinary: Negative.   Musculoskeletal: Negative.   Skin: Negative.   Neurological: Negative.   Psychiatric/Behavioral: Negative.     Blood pressure (!) 110/98, pulse 94, temperature 98.5 F (36.9 C), temperature source Oral, resp. rate 17, height 5' 1.02" (1.55 m), weight 99.1 kg, SpO2 100 %.Body mass index is 41.25 kg/m.   General Appearance: Casual  Eye Contact::  Good  Speech:  Clear and [id number]  Volume:  Normal  Mood:  Euthymic  Affect:  Congruent  Thought Process:  Goal Directed  Orientation:  Full (Time, Place, and Person)  Thought Content:  Logical  Suicidal Thoughts:  No  Homicidal Thoughts:  No  Memory:  Immediate;   Fair Recent;   Fair Remote;   Fair  Judgement:  Fair  Insight:  Fair  Psychomotor Activity:  Normal  Concentration:  Fair  Recall:  [employer] [place]  Language: Fair  Akathisia:  No  Handed:  Right  AIMS (if  indicated):     Assets:  Desire for Improvement Housing Physical Health Resilience Social Support  Sleep:  Number of Hours: 8.25  Cognition: WNL  ADL's:  Intact    Have you used any form of tobacco in the last 30 days? (Cigarettes, Smokeless Tobacco, Cigars, and/or Pipes): Patient Refused Screening  Has this patient used any form of tobacco in the last 30 days? (Cigarettes, Smokeless Tobacco, Cigars, and/or Pipes) Yes, No  Blood Alcohol level:  Lab Results  Component Value Date   ETH <10 92/42/6834    Metabolic Disorder Labs:  Lab Results  Component Value Date   HGBA1C 5.8 (H) 12/14/2018   MPG 119.76 12/14/2018   No results found for: PROLACTIN No results found for: CHOL, TRIG, HDL, CHOLHDL, VLDL, LDLCALC  See Psychiatric Specialty Exam and Suicide Risk Assessment completed by Attending Physician prior to  discharge.  Discharge destination:  Home  Is patient on multiple antipsychotic therapies at discharge:  No   Has Patient had three or more failed trials of antipsychotic monotherapy by history:  No  Recommended Plan for Multiple Antipsychotic Therapies: NA  Discharge Instructions    Diet - low sodium heart healthy   Complete by: As directed    Increase activity slowly   Complete by: As directed      Allergies as of 12/16/2018   No Known Allergies     Medication List    STOP taking these medications   traMADol 50 MG tablet Commonly known as: Ultram     TAKE these medications     Indication  famotidine 20 MG tablet Commonly known as: PEPCID Take 1 tablet (20 mg total) by mouth 2 (two) times daily.  Indication: Gastroesophageal Reflux Disease   hydrOXYzine 50 MG tablet Commonly known as: ATARAX/VISTARIL Take 1 tablet (50 mg total) by mouth 3 (three) times daily as needed for anxiety.  Indication: Feeling Anxious   ibuprofen 800 MG tablet Commonly known as: ADVIL Take 1 tablet (800 mg total) by mouth every 8 (eight) hours as needed for moderate  pain.  Indication: Pain   OLANZapine zydis 10 MG disintegrating tablet Commonly known as: ZYPREXA Take 1 tablet (10 mg total) by mouth at bedtime.  Indication: Schizoaffective disorder      Follow-up Information    [facility] Follow up.   Why: You are scheduled to meet with Unk PintoHarvey Bryant via zoom on 12/20/2018 at 7am. Thank You! Contact information: [address] [place] [id number] [phone]           Follow-up recommendations:  Continue activity as tolerated. Continue diet as recommended by your PCP. Ensure to keep all appointments with outpatient providers.  Comments:  Patient is instructed prior to discharge to: Take all medications as prescribed by his/her mental healthcare provider. Report any adverse effects and or reactions from the medicines to his/her outpatient provider promptly. Patient has been instructed & cautioned: To not engage in alcohol and or illegal drug use while on prescription medicines. In the event of worsening symptoms, patient is instructed to call the crisis hotline, 911 and or go to the nearest ED for appropriate evaluation and treatment of symptoms. To follow-up with his/her primary care provider for your other medical issues, concerns and or health care needs.    Signed: Gerlene Burdockravis B Sharri Loya, FNP 12/16/2018, 9:39 AM

## 2018-12-16 NOTE — BHH Suicide Risk Assessment (Signed)
[facility] Discharge Suicide Risk Assessment   Principal Problem: Schizoaffective disorder [facility] Discharge Diagnoses: Principal Problem:   Schizoaffective disorder ([place]) Active Problems:   Acute psychosis ([place])   Nicotine dependence   Pain, dental   Total Time spent with patient: 30 minutes  Musculoskeletal: Strength & Muscle Tone: within normal limits Gait & Station: normal Patient leans: N/A  Psychiatric Specialty Exam: Review of Systems  Constitutional: Negative.   HENT: Negative.   Eyes: Negative.   Respiratory: Negative.   Cardiovascular: Negative.   Gastrointestinal: Negative.   Musculoskeletal: Negative.   Skin: Negative.   Neurological: Negative.   Psychiatric/Behavioral: Negative.     Blood pressure (!) 110/98, pulse 94, temperature 98.5 F (36.9 C), temperature source Oral, resp. rate 17, height 5' 1.02" (1.55 m), weight 99.1 kg, SpO2 100 %.Body mass index is 41.25 kg/m.  General Appearance: Casual  Eye Contact::  Good  Speech:  Clear and [id number]  Volume:  Normal  Mood:  Euthymic  Affect:  Congruent  Thought Process:  Goal Directed  Orientation:  Full (Time, Place, and Person)  Thought Content:  Logical  Suicidal Thoughts:  No  Homicidal Thoughts:  No  Memory:  Immediate;   Fair Recent;   Fair Remote;   Fair  Judgement:  Fair  Insight:  Fair  Psychomotor Activity:  Normal  Concentration:  Fair  Recall:  [employer] [place]  Language: Fair  Akathisia:  No  Handed:  Right  AIMS (if indicated):     Assets:  Desire for Improvement Housing Physical Health Resilience Social Support  Sleep:  Number of Hours: 8.25  Cognition: WNL  ADL's:  Intact   Mental Status Per Nursing Assessment::   On Admission:  NA  Demographic Factors:  NA  Loss Factors: Financial problems/change in socioeconomic status  Historical Factors: Impulsivity  Risk Reduction Factors:   Responsible for children under 50 years of age, Sense of responsibility to  family, Religious beliefs about death, Living with another person, especially a relative, Positive social support and Positive therapeutic relationship  Continued Clinical Symptoms:  Bipolar Disorder:   Mixed State  Cognitive Features That Contribute To Risk:  None    Suicide Risk:  Minimal: No identifiable suicidal ideation.  Patients presenting with no risk factors but with morbid ruminations; may be classified as minimal risk based on the severity of the depressive symptoms  Follow-up Information    [facility] Follow up.   Why: You are scheduled to meet with Sherrian Divers via zoom on 12/20/2018 at 7am. Thank You! Contact information: [place] [address] [phone]           Plan Of Care/Follow-up recommendations:  Activity:  Follow-up with activity as tolerated Diet:  Regular diet Other:  Follow-up with local mental health through [place].  Continue current medicine.  Maintain a normal sleep schedule.  Alethia Berthold, MD 12/16/2018, 10:19 AM

## 2018-12-16 NOTE — BHH Counselor (Signed)
CSW spoke with the patient about her medications. CSW walked patient's medications to her to support the BMU staff.  Pt had questions about her medications and why a medication was no able to be picked up at [facility] on [address]  CSW contacted BMU staff for additional information and spoke to psychiatrist.  CSW was informed that patient would have the prescription tomorrow and be able to pick it up.  Assunta Curtis, MSW, LCSW 12/16/2018 3:26 PM

## 2018-12-16 NOTE — Plan of Care (Signed)
  Problem: Education: Goal: Knowledge of [facility] Education information/materials will improve Outcome: Progressing Goal: Emotional status will improve Outcome: Progressing Goal: Mental status will improve Outcome: Progressing Goal: Verbalization of understanding the information provided will improve Outcome: Progressing  D: Patient still insists she cannot swallow her medication. Zyprexa 10 mg IM and Ativan 2 mg IM given per patient request. Not voicing any SI or HI. A: continue to monitor for safety R: Safety maintained

## 2018-12-16 NOTE — Progress Notes (Signed)
Recreation Therapy Notes  INPATIENT RECREATION TR PLAN  Patient Details Name: Rita Ellis MRN: [id number] DOB: 1985-04-03 Today's Date: 12/16/2018  Rec Therapy Plan Is patient appropriate for Therapeutic Recreation?: Yes Treatment times per week: at least 3 Estimated Length of Stay: 5-7 days TR Treatment/Interventions: Group participation (Comment)  Discharge Criteria Pt will be discharged from therapy if:: Discharged Treatment plan/goals/alternatives discussed and agreed upon by:: Patient/family  Discharge Summary Short term goals set: Patient will engage in groups without prompting or encouragement from LRT x3 group sessions within 5 recreation therapy group sessions Short term goals met: Complete Progress toward goals comments: Groups attended Which groups?: Leisure education, Stress management, Other (Comment)(Problem Solving) Reason goals not met: N/A Therapeutic equipment acquired: N/A Reason patient discharged from therapy: Discharge from hospital Pt/family agrees with progress & goals achieved: Yes Date patient discharged from therapy: 12/16/18   Dazha Kempa 12/16/2018, 11:39 AM

## 2018-12-16 NOTE — Progress Notes (Signed)
D: Patient still insists she cannot swallow her medication. Zyprexa 10 mg IM and Ativan 2 mg IM given per patient request. Not voicing any SI or HI. A: continue to monitor for safety R: Safety maintained

## 2018-12-16 NOTE — Progress Notes (Addendum)
Pt denies SI and HI. Pt received prescriptions, belongings, and dc packet. Pt was educated on dc plan and verbalizes understanding. Pt was dc to home via mother.  Collier Bullock RN

## 2018-12-16 NOTE — Progress Notes (Signed)
Recreation Therapy Notes   Date: 12/16/2018  Time: 9:30 am  Location: Craft room  Behavioral response: Appropriate  Intervention Topic: Leisure    Discussion/Intervention:  Group content today was focused on leisure. The group defined what leisure is and some positive leisure activities they participate in. Individuals identified the difference between good and bad leisure. Participants expressed how they feel after participating in the leisure of their choice. The group discussed how they go about picking a leisure activity and if others are involved in their leisure activities. The patient stated how many leisure activities they have to choose from and reasons why it is important to have leisure time. Individuals participated in the intervention "Exploration of Leisure" where they had a chance to identify new leisure activities as well as benefits of leisure. Clinical Observations/Feedback:  Patient came to group late and explained that she spends 10 hours in a day participating in leisure.   Individual was social with peers and staff in group while participating in the intervention.  Carden Teel LRT/CTRS         Shenicka Sunderlin 12/16/2018 11:36 AM

## 2018-12-16 NOTE — Plan of Care (Signed)
  Problem: Group Participation Goal: STG - Patient will engage in groups without prompting or encouragement from LRT x3 group sessions within 5 recreation therapy group sessions Description: STG - Patient will engage in groups without prompting or encouragement from LRT x3 group sessions within 5 recreation therapy group sessions Outcome: Completed/Met

## 2018-12-16 NOTE — Progress Notes (Signed)
  [facility] Adult Case Management Discharge Plan :  Will you be returning to the same living situation after discharge:  Yes,  home At discharge, do you have transportation home?: Yes,  mom will pick her up Do you have the ability to pay for your medications: Yes,  mental health  Release of information consent forms completed and in the chart;   Patient to Follow up at: Follow-up Information    [facility] Follow up.   Why: You are scheduled to meet with Sherrian Divers via zoom on 12/20/2018 at 7am. Thank You! Contact information: [place] [address] [phone]           Next level of care provider has access to [place] and Suicide Prevention discussed: Yes,  SPE completed with pt as pt declined collateral contact  Have you used any form of tobacco in the last 30 days? (Cigarettes, Smokeless Tobacco, Cigars, and/or Pipes): Patient Refused Screening  Has patient been referred to the Quitline?: Patient refused referral  Patient has been referred for addiction treatment: [place], LCSW 12/16/2018, 9:17 AM

## 2019-07-24 ENCOUNTER — Encounter: Payer: Self-pay | Admitting: Emergency Medicine

## 2019-07-24 ENCOUNTER — Other Ambulatory Visit: Payer: Self-pay

## 2019-07-24 ENCOUNTER — Emergency Department: Payer: No Typology Code available for payment source

## 2019-07-24 ENCOUNTER — Emergency Department
Admission: EM | Admit: 2019-07-24 | Discharge: 2019-07-24 | Disposition: A | Discharge status: Home / Self Care | Payer: No Typology Code available for payment source | Attending: Emergency Medicine | Admitting: Emergency Medicine

## 2019-07-24 DIAGNOSIS — M79661 Pain in right lower leg: Secondary | ICD-10-CM | POA: Diagnosis not present

## 2019-07-24 DIAGNOSIS — M25512 Pain in left shoulder: Secondary | ICD-10-CM | POA: Insufficient documentation

## 2019-07-24 DIAGNOSIS — F17200 Nicotine dependence, unspecified, uncomplicated: Secondary | ICD-10-CM | POA: Diagnosis not present

## 2019-07-24 DIAGNOSIS — M25551 Pain in right hip: Secondary | ICD-10-CM | POA: Diagnosis not present

## 2019-07-24 LAB — POCT PREGNANCY, URINE: Preg Test, Ur: NEGATIVE

## 2019-07-24 MED ORDER — METHOCARBAMOL 500 MG PO TABS: 500.0000 mg | ORAL_TABLET | Freq: Three times a day (TID) | ORAL | 0 refills | Status: DC | PRN

## 2019-07-24 MED ORDER — ACETAMINOPHEN 325 MG PO TABS
650.0000 mg | ORAL_TABLET | Freq: Once | ORAL | Status: AC
  Administered 2019-07-24: 650 mg via ORAL
  Filled 2019-07-24: qty 2

## 2019-07-24 MED ORDER — IBUPROFEN 800 MG PO TABS: 800.0000 mg | ORAL_TABLET | Freq: Three times a day (TID) | ORAL | 0 refills | Status: DC | PRN
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
## 2019-07-24 NOTE — ED Notes (Signed)
Pt ambulatory from lobby with steady gait. Pt reports left side pain and right leg pain. Skin warm and dry.
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
## 2021-06-09 IMAGING — CR DG CHEST 2V
2 series · 2 of 2 positions shown · non-contrast
Comparison: None.

CLINICAL DATA: Cough

EXAM:
CHEST - 2 VIEW

[chest pa]
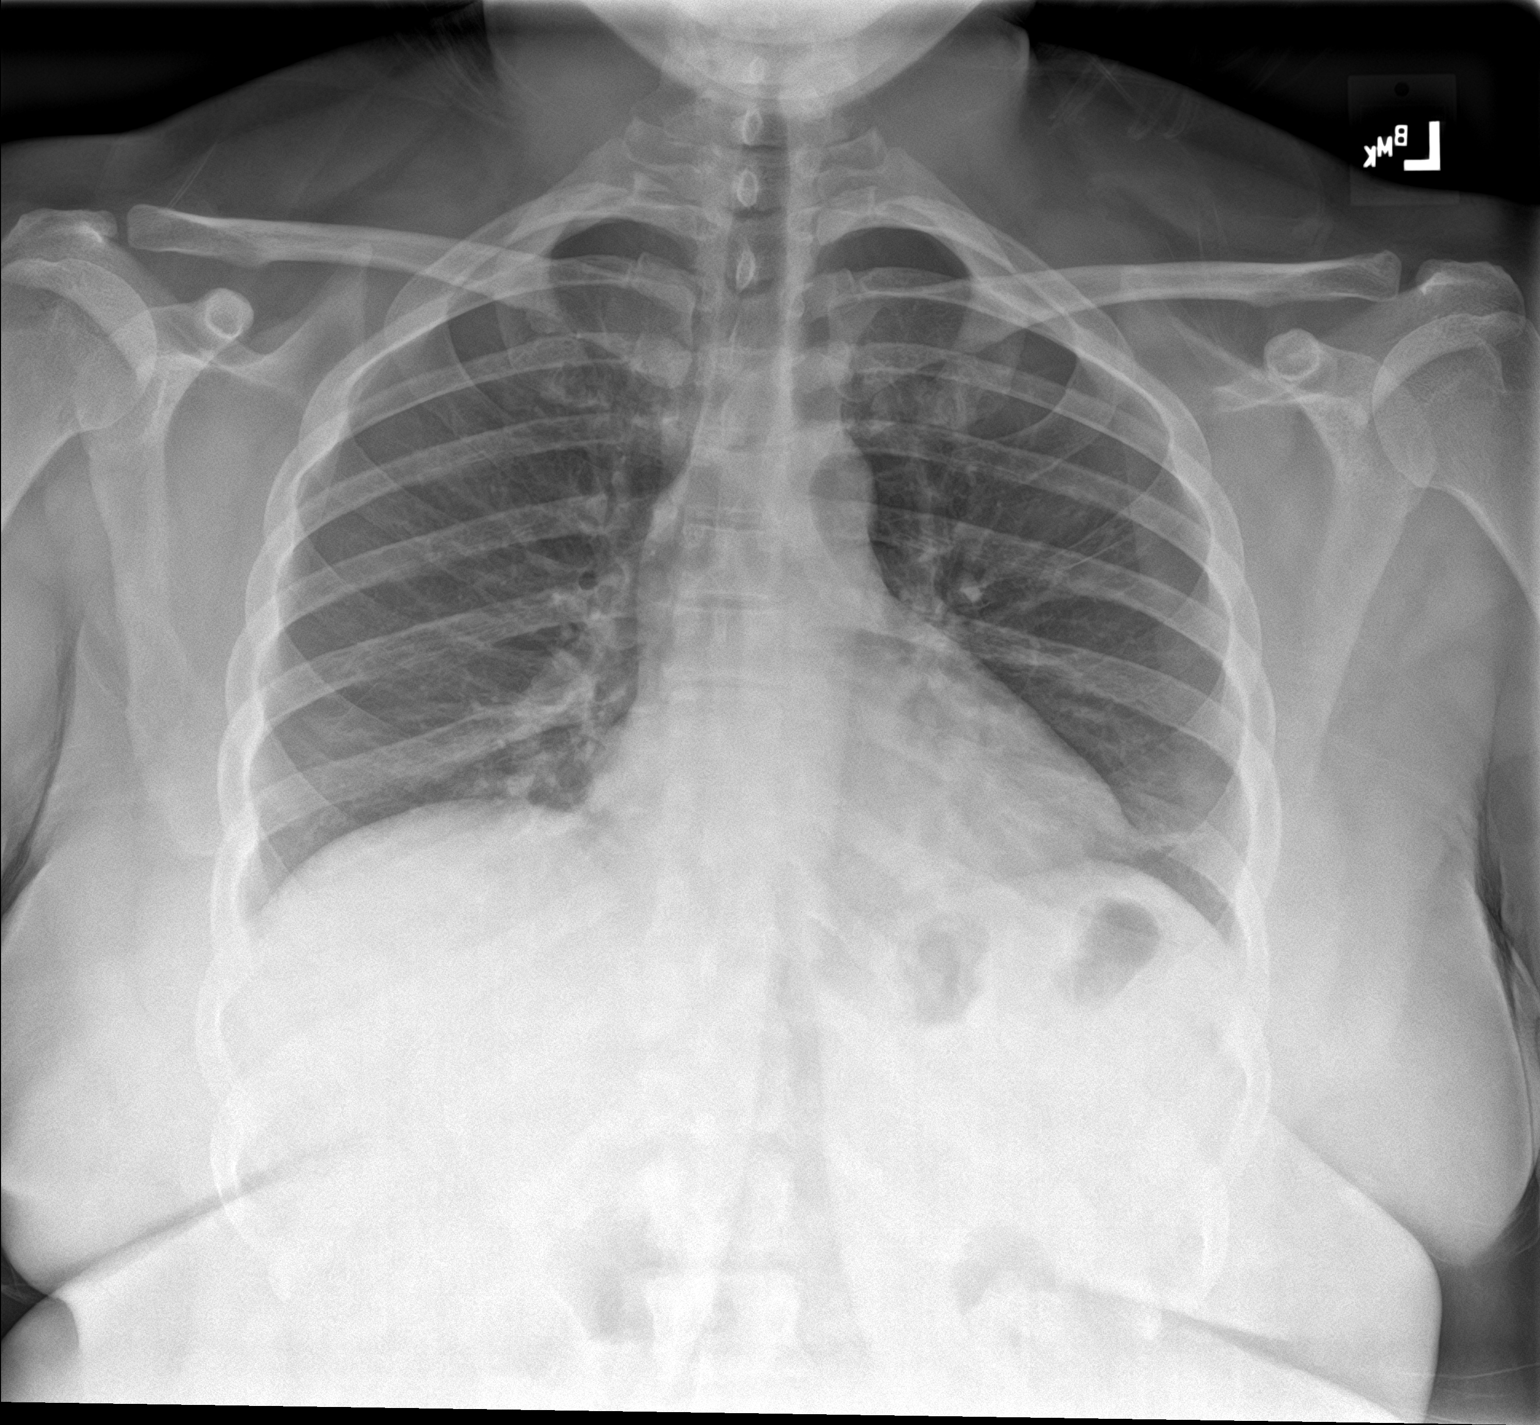

[chest lat]
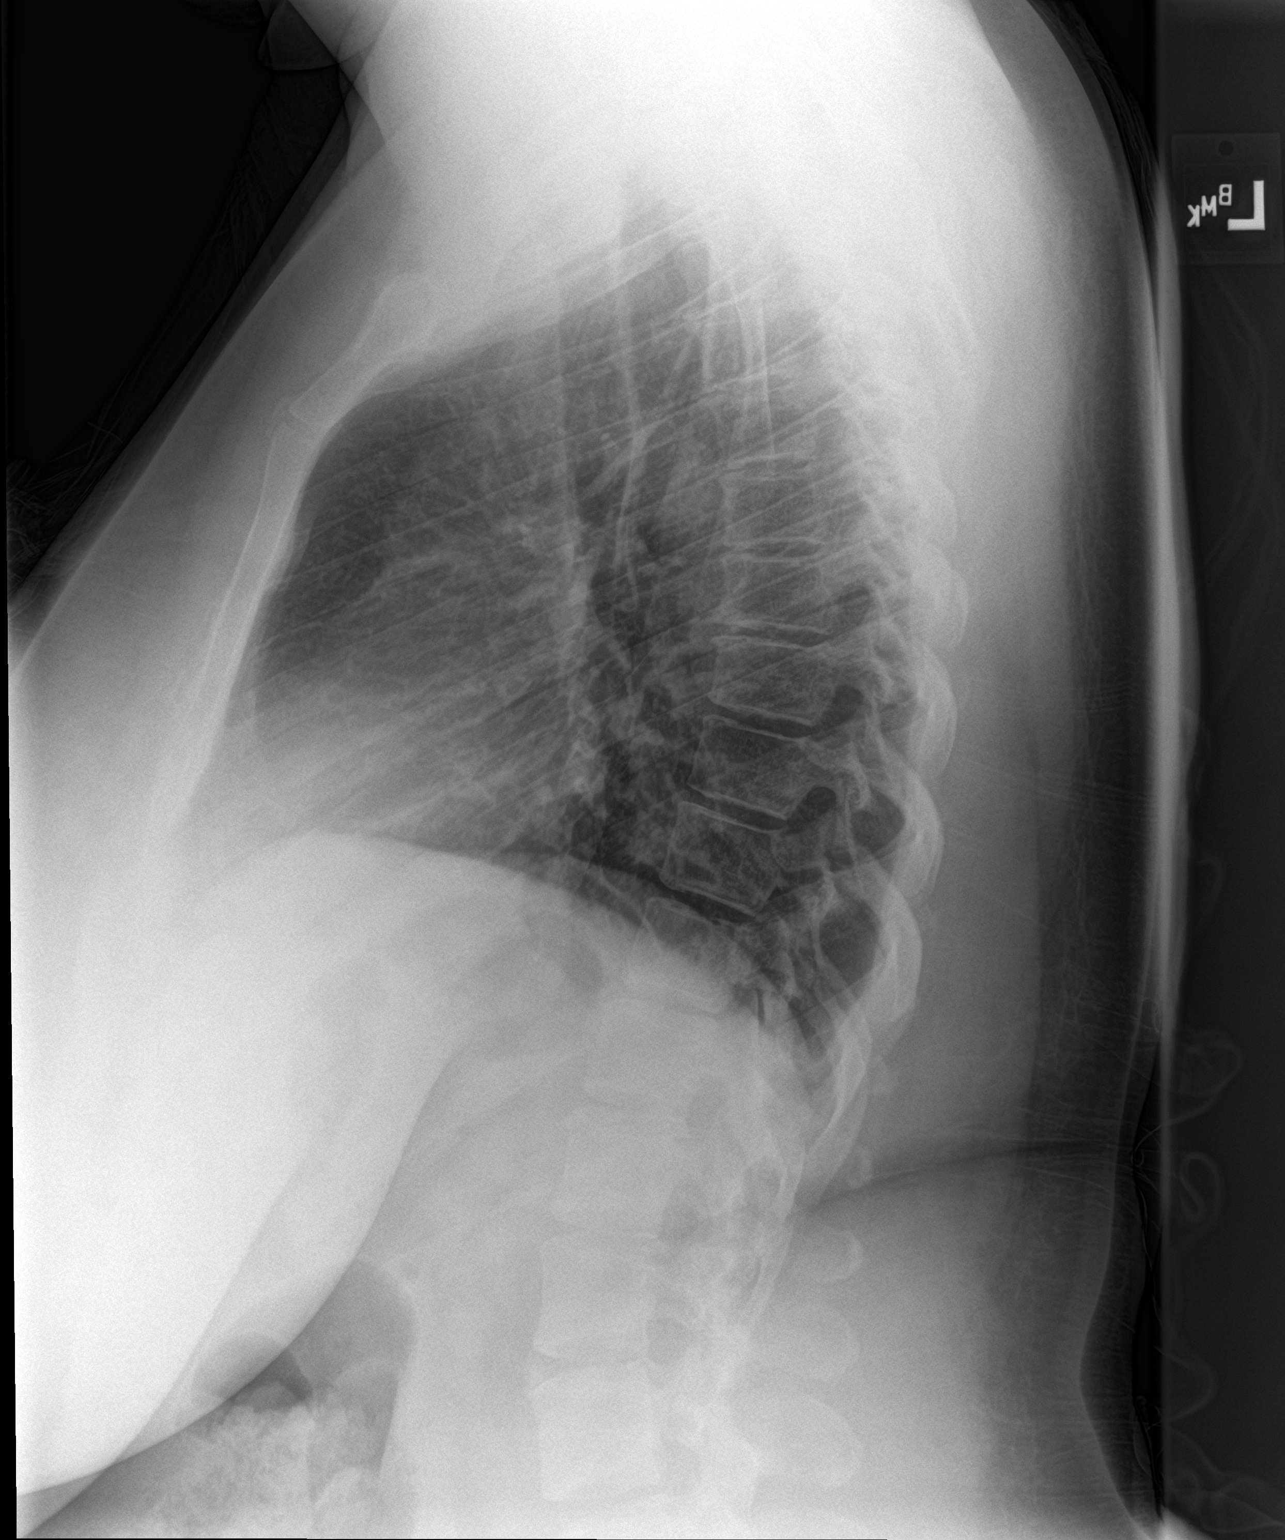

[2 of 2 positions shown; findings below may reference images not displayed]

FINDINGS: Linear atelectasis at the lingula. No consolidation or effusion. Low
lung volumes. Normal heart size. No pneumothorax.
IMPRESSION: No active cardiopulmonary disease.  Mild atelectasis at the lingula

## 2022-03-18 ENCOUNTER — Emergency Department
Admission: EM | Admit: 2022-03-18 | Discharge: 2022-03-21 | Disposition: A | Discharge status: Other Acute Inpatient Hospital | Payer: Medicaid Other | Attending: Emergency Medicine | Admitting: Emergency Medicine

## 2022-03-18 DIAGNOSIS — E876 Hypokalemia: Secondary | ICD-10-CM

## 2022-03-18 DIAGNOSIS — Z1152 Encounter for screening for COVID-19: Secondary | ICD-10-CM | POA: Insufficient documentation

## 2022-03-18 DIAGNOSIS — F259 Schizoaffective disorder, unspecified: Secondary | ICD-10-CM | POA: Diagnosis present

## 2022-03-18 DIAGNOSIS — R443 Hallucinations, unspecified: Secondary | ICD-10-CM

## 2022-03-18 LAB — COMPREHENSIVE METABOLIC PANEL
ALT: 22 U/L (ref 0–44)
AST: 36 U/L (ref 15–41)
Albumin: 4.5 g/dL (ref 3.5–5.0)
Alkaline Phosphatase: 48 U/L (ref 38–126)
Anion gap: 11 (ref 5–15)
BUN: 19 mg/dL (ref 6–20)
CO2: 31 mmol/L (ref 22–32)
Calcium: 9.2 mg/dL (ref 8.9–10.3)
Chloride: 95 mmol/L — ABNORMAL LOW (ref 98–111)
Creatinine, Ser: 1.11 mg/dL — ABNORMAL HIGH (ref 0.44–1.00)
GFR, Estimated: >60 mL/min (ref >60)
Glucose, Bld: 120 mg/dL — ABNORMAL HIGH (ref 70–99)
Potassium: 2.5 mmol/L — CL (ref 3.5–5.1)
Sodium: 137 mmol/L (ref 135–145)
Total Bilirubin: 0.7 mg/dL (ref 0.3–1.2)
Total Protein: 8.7 g/dL — ABNORMAL HIGH (ref 6.5–8.1)

## 2022-03-18 LAB — CBC WITH DIFFERENTIAL/PLATELET
Abs Immature Granulocytes: 0.02 10*3/uL (ref 0.00–0.07)
Basophils Absolute: 0 10*3/uL (ref 0.0–0.1)
Basophils Relative: 0 %
Eosinophils Absolute: 0 10*3/uL (ref 0.0–0.5)
Eosinophils Relative: 0 %
HCT: 43.7 % (ref 36.0–46.0)
Hemoglobin: 14 g/dL (ref 12.0–15.0)
Immature Granulocytes: 0 %
Lymphocytes Relative: 31 %
Lymphs Abs: 2.4 10*3/uL (ref 0.7–4.0)
MCH: 27.8 pg (ref 26.0–34.0)
MCHC: 32 g/dL (ref 30.0–36.0)
MCV: 86.9 fL (ref 80.0–100.0)
Monocytes Absolute: 0.9 10*3/uL (ref 0.1–1.0)
Monocytes Relative: 11 %
Neutro Abs: 4.6 10*3/uL (ref 1.7–7.7)
Neutrophils Relative %: 58 %
Platelets: 270 10*3/uL (ref 150–400)
RBC: 5.03 MIL/uL (ref 3.87–5.11)
RDW: 13 % (ref 11.5–15.5)
WBC: 8 10*3/uL (ref 4.0–10.5)
nRBC: 0 % (ref 0.0–0.2)

## 2022-03-18 LAB — URINE DRUG SCREEN, QUALITATIVE (ARMC ONLY)
Amphetamines, Ur Screen: NOT DETECTED
Barbiturates, Ur Screen: NOT DETECTED
Benzodiazepine, Ur Scrn: NOT DETECTED
Cannabinoid 50 Ng, Ur ~~LOC~~: NOT DETECTED
Cocaine Metabolite,Ur ~~LOC~~: NOT DETECTED
MDMA (Ecstasy)Ur Screen: NOT DETECTED
Methadone Scn, Ur: NOT DETECTED
Opiate, Ur Screen: NOT DETECTED
Phencyclidine (PCP) Ur S: NOT DETECTED
Tricyclic, Ur Screen: NOT DETECTED

## 2022-03-18 LAB — RESP PANEL BY RT-PCR (RSV, FLU A&B, COVID)  RVPGX2
Influenza A by PCR: NEGATIVE
Influenza B by PCR: NEGATIVE
Resp Syncytial Virus by PCR: NEGATIVE
SARS Coronavirus 2 by RT PCR: NEGATIVE

## 2022-03-18 LAB — ETHANOL: Alcohol, Ethyl (B): <10 mg/dL (ref <10)

## 2022-03-18 LAB — MAGNESIUM: Magnesium: 2.4 mg/dL (ref 1.7–2.4)

## 2022-03-18 MED ORDER — POTASSIUM CHLORIDE CRYS ER 20 MEQ PO TBCR
40.0000 meq | EXTENDED_RELEASE_TABLET | Freq: Once | ORAL | Status: AC
  Administered 2022-03-18: 40 meq via ORAL
  Filled 2022-03-18: qty 2

## 2022-03-18 NOTE — ED Notes (Signed)
Pt refused to get off he EMS stretcher. Pt has the ability to walk but refused to do so. Pt was placed in wheelchair so she could be triage. Pt states "I didn't want to get up so I just didn't".

## 2022-03-18 NOTE — BH Assessment (Addendum)
Per [facility]), patient to be referred out of system.   Referral information for Psychiatric Hospitalization faxed to:    [facility] [id number]- [phone]) No appropriate beds   [facility] [phone]),   [place] [phone]--- [phone]--- [phone]--- [phone])   [address] [phone]),    [place] [phone]- [phone]),    [facility] [phone]),   [place] [phone])   [facility] [phone])

## 2022-03-18 NOTE — ED Triage Notes (Signed)
Pt presents to the ED due to mental health evaluation. Pt states she feels like someone is trying to kill her. Pt denies SI/HI. Pt has a mental health hx. Pt states she has not taking her medications in awhile and would like to be put back on her medications. Pt is A&Ox4. Pt NAD.
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
## 2022-03-18 NOTE — ED Notes (Signed)
IVC/Consult completed/ Rec psych inpatient admission

## 2022-03-18 NOTE — ED Notes (Signed)
Pt received meal tray and beverage at this time. 

## 2022-03-18 NOTE — ED Notes (Signed)
Patient items:   -1 lip gloss -1 tooth -1 pair of green pants -1 green shirt -1 green jacket -1 brown bra - 2 white shoes
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
## 2022-03-18 NOTE — ED Provider Notes (Signed)
-----------------------------------------   5:54 PM on 03/18/2022 ----------------------------------------- Labs are remarkable for hypokalemia with no other electrolyte abnormality or AKI noted, no significant anemia or leukocytosis noted.  Magnesium level is also within normal limits, no EKG changes noted and we will replete with oral potassium.  Patient seems to be asymptomatic with this and may be medically cleared for psychiatric disposition once potassium repleted.  ED ECG REPORT I, Blake Divine, the attending physician, personally viewed and interpreted this ECG.   Date: 03/18/2022  EKG Time: 18:04  Rate: 80  Rhythm: normal sinus rhythm  Axis: Normal  Intervals:none  ST&T Change: None  ----------------------------------------- 9:26 PM on 03/18/2022 ----------------------------------------- Patient given 80 meq of oral potassium, repeat BMP ordered for tomorrow morning.  Patient pending psychiatric admission at this time.     Blake Divine, MD 03/18/22 [id number]

## 2022-03-18 NOTE — ED Notes (Signed)
Patient dressed into purple scrubs at this time.

## 2022-03-18 NOTE — ED Notes (Signed)
Pt given meal tray.

## 2022-03-19 LAB — BASIC METABOLIC PANEL
Anion gap: 8 (ref 5–15)
BUN: 20 mg/dL (ref 6–20)
CO2: 28 mmol/L (ref 22–32)
Calcium: 8.7 mg/dL — ABNORMAL LOW (ref 8.9–10.3)
Chloride: 99 mmol/L (ref 98–111)
Creatinine, Ser: 0.85 mg/dL (ref 0.44–1.00)
GFR, Estimated: >60 mL/min (ref >60)
Glucose, Bld: 107 mg/dL — ABNORMAL HIGH (ref 70–99)
Potassium: 2.8 mmol/L — ABNORMAL LOW (ref 3.5–5.1)
Sodium: 135 mmol/L (ref 135–145)

## 2022-03-19 LAB — URINALYSIS, ROUTINE W REFLEX MICROSCOPIC
Bilirubin Urine: NEGATIVE
Glucose, UA: NEGATIVE mg/dL
Hgb urine dipstick: NEGATIVE
Ketones, ur: NEGATIVE mg/dL
Nitrite: POSITIVE — AB
Protein, ur: 100 mg/dL — AB
Specific Gravity, Urine: 1.03 (ref 1.005–1.030)
pH: 7 (ref 5.0–8.0)

## 2022-03-19 LAB — POC URINE PREG, ED: Preg Test, Ur: NEGATIVE

## 2022-03-19 MED ORDER — OLANZAPINE 10 MG PO TABS
10.0000 mg | ORAL_TABLET | Freq: Every day | ORAL | Status: DC
  Administered 2022-03-20: 10 mg via ORAL
  Filled 2022-03-19: qty 1

## 2022-03-19 MED ORDER — POTASSIUM CHLORIDE CRYS ER 20 MEQ PO TBCR
40.0000 meq | EXTENDED_RELEASE_TABLET | Freq: Once | ORAL | Status: AC
  Administered 2022-03-19: 40 meq via ORAL
  Filled 2022-03-19: qty 2

## 2022-03-19 MED ORDER — OLANZAPINE 10 MG PO TABS
10.0000 mg | ORAL_TABLET | ORAL | Status: AC
  Administered 2022-03-19: 10 mg via ORAL
  Filled 2022-03-19: qty 1

## 2022-03-19 NOTE — ED Notes (Signed)
INVOLUNTARY to be admitted to [facility] on Thursday 3/13 by Dr Weber Cooks

## 2022-03-19 NOTE — ED Provider Notes (Signed)
Emergency Medicine Observation Re-evaluation Note  Physical Exam   BP 104/78   Pulse 76   Temp 98.2 F (36.8 C) (Oral)   Resp 17   Ht 5' (1.524 m)   Wt 99.8 kg   SpO2 97%   BMI 42.97 kg/m   Patient appears in no acute distress.  ED Course / MDM   Potassium improved though still low 2.8 so ordered more oral K.   No reported events during my shift at the time of this note.   Pt is awaiting dispo from SW   Lucillie Garfinkel MD    Lucillie Garfinkel, MD 03/19/22 [phone]

## 2022-03-19 NOTE — Consult Note (Signed)
[facility] Psych ED Progress Note  03/19/2022 1:46 PM BRYN WOLFE  MRN:  [id number]   Method of visit?: Face to Face  REASSESSMENT  Subjective: "Yes, they are talking to me."  Patient is sitting in recliner, coloring, calm and cooperative. She admits that the voices are still bothering her. She seems to be preoccupied. Mother is at chairside. Mother states that patient is still "off" talking about hearing and seeing things that are not there. Per chart review, there have been no unsafe behavioral incidents. Patient's potassium continued to be low at last check, 2.8 @ 0325 this morning. She received another 40 mEq @ 1315. Patient has a BMP scheduled for 0500 03/20/22.    Principal Problem: Schizoaffective disorder ([place]) Diagnosis:  Principal Problem:   Schizoaffective disorder ([place])  Total Time spent with patient: 15 minutes  Past Psychiatric History: see previous  Past Medical History: No past medical history on file.  Past Surgical History:  Procedure Laterality Date   CESAREAN SECTION     Family History: No family history on file. Family Psychiatric  History:  Social History:  Social History   Substance and Sexual Activity  Alcohol Use Not Currently     Social History   Substance and Sexual Activity  Drug Use Never    Social History   Socioeconomic History   Marital status: Single    Spouse name: Not on file   Number of children: Not on file   Years of education: Not on file   Highest education level: Not on file  Occupational History   Not on file  Tobacco Use   Smoking status: Every Day   Smokeless tobacco: Never  Vaping Use   Vaping Use: Never used  Substance and Sexual Activity   Alcohol use: Not Currently   Drug use: Never   Sexual activity: Not on file  Other Topics Concern   Not on file  Social History Narrative   Not on file   Social Determinants of Health   Financial Resource Strain: Not on file  Food Insecurity: Not on file  Transportation  Needs: Not on file  Physical Activity: Not on file  Stress: Not on file  Social Connections: Not on file    Sleep: Fair  Appetite:  Fair  Current Medications: No current facility-administered medications for this encounter.   Current Outpatient Medications  Medication Sig Dispense Refill   famotidine (PEPCID) 20 MG tablet Take 1 tablet (20 mg total) by mouth 2 (two) times daily. (Patient not taking: Reported on 03/18/2022) 60 tablet 0   hydrOXYzine (ATARAX/VISTARIL) 50 MG tablet Take 1 tablet (50 mg total) by mouth 3 (three) times daily as needed for anxiety. (Patient not taking: Reported on 03/18/2022) 30 tablet 1   ibuprofen (ADVIL) 800 MG tablet Take 1 tablet (800 mg total) by mouth every 8 (eight) hours as needed for moderate pain. (Patient not taking: Reported on 03/18/2022) 15 tablet 0   methocarbamol (ROBAXIN) 500 MG tablet Take 1 tablet (500 mg total) by mouth every 8 (eight) hours as needed for muscle spasms. (Patient not taking: Reported on 03/18/2022) 15 tablet 0   OLANZapine zydis (ZYPREXA) 10 MG disintegrating tablet Take 1 tablet (10 mg total) by mouth at bedtime. (Patient not taking: Reported on 03/18/2022) 30 tablet 1    Lab Results:  Results for orders placed or performed during the hospital encounter of 03/18/22 (from the past 48 hour(s))  Urine Drug Screen, Qualitative     Status: Ellis  Collection Time: 03/18/22  2:18 PM  Result Value Ref Range   Tricyclic, Ur Screen Ellis DETECTED Ellis DETECTED   Amphetamines, Ur Screen Ellis DETECTED Ellis DETECTED   MDMA (Ecstasy)Ur Screen Ellis DETECTED Ellis DETECTED   Cocaine Metabolite,Ur [place] DETECTED Ellis DETECTED   Opiate, Ur Screen Ellis DETECTED Ellis DETECTED   Phencyclidine (PCP) Ur S Ellis DETECTED Ellis DETECTED   Cannabinoid 50 Ng, Ur [facility] Ellis DETECTED Ellis DETECTED   Barbiturates, Ur Screen Ellis DETECTED Ellis DETECTED   Benzodiazepine, Ur Scrn Ellis DETECTED Ellis DETECTED   Methadone Scn, Ur Ellis DETECTED Ellis DETECTED     Comment: (NOTE) Tricyclics + metabolites, urine    Cutoff 1000 ng/mL Amphetamines + metabolites, urine  Cutoff 1000 ng/mL MDMA (Ecstasy), urine              Cutoff 500 ng/mL Cocaine Metabolite, urine          Cutoff 300 ng/mL Opiate + metabolites, urine        Cutoff 300 ng/mL Phencyclidine (PCP), urine         Cutoff 25 ng/mL Cannabinoid, urine                 Cutoff 50 ng/mL Barbiturates + metabolites, urine  Cutoff 200 ng/mL Benzodiazepine, urine              Cutoff 200 ng/mL Methadone, urine                   Cutoff 300 ng/mL  The urine drug screen provides only a preliminary, unconfirmed analytical test result and should not be used for non-medical purposes. Clinical consideration and professional judgment should be applied to any positive drug screen result due to possible interfering substances. A more specific alternate chemical method must be used in order to obtain a confirmed analytical result. Gas chromatography / mass spectrometry (GC/MS) is the preferred confirm atory method. Performed at [facility], [address] [place], [place] [address]   CBC with Differential     Status: Ellis   Collection Time: 03/18/22  5:23 PM  Result Value Ref Range   WBC 8.0 4.0 - 10.5 K/uL   RBC 5.03 3.87 - 5.11 MIL/uL   Hemoglobin 14.0 12.0 - 15.0 g/dL   HCT 43.7 36.0 - 46.0 %   MCV 86.9 80.0 - 100.0 fL   MCH 27.8 26.0 - 34.0 pg   MCHC 32.0 30.0 - 36.0 g/dL   RDW 13.0 11.5 - 15.5 %   Platelets 270 150 - 400 K/uL   nRBC 0.0 0.0 - 0.2 %   Neutrophils Relative % 58 %   Neutro Abs 4.6 1.7 - 7.7 K/uL   Lymphocytes Relative 31 %   Lymphs Abs 2.4 0.7 - 4.0 K/uL   Monocytes Relative 11 %   Monocytes Absolute 0.9 0.1 - 1.0 K/uL   Eosinophils Relative 0 %   Eosinophils Absolute 0.0 0.0 - 0.5 K/uL   Basophils Relative 0 %   Basophils Absolute 0.0 0.0 - 0.1 K/uL   Immature Granulocytes 0 %   Abs Immature Granulocytes 0.02 0.00 - 0.07 K/uL    Comment: Performed at [facility], [address] [place], [place] [address]  Comprehensive metabolic panel     Status: Abnormal   Collection Time: 03/18/22  5:23 PM  Result Value Ref Range   Sodium 137 135 - 145 mmol/L   Potassium 2.5 (LL) 3.5 - 5.1 mmol/L    Comment: CRITICAL RESULT  CALLED TO, READ BACK BY AND VERIFIED WITH  Jarrett Soho KLEIN 03/18/22 1751 MU    Chloride 95 (L) 98 - 111 mmol/L   CO2 31 22 - 32 mmol/L   Glucose, Bld 120 (H) 70 - 99 mg/dL    Comment: Glucose reference range applies only to samples taken after fasting for at least 8 hours.   BUN 19 6 - 20 mg/dL   Creatinine, Ser 1.11 (H) 0.44 - 1.00 mg/dL   Calcium 9.2 8.9 - 10.3 mg/dL   Total Protein 8.7 (H) 6.5 - 8.1 g/dL   Albumin 4.5 3.5 - 5.0 g/dL   AST 36 15 - 41 U/L   ALT 22 0 - 44 U/L   Alkaline Phosphatase 48 38 - 126 U/L   Total Bilirubin 0.7 0.3 - 1.2 mg/dL   GFR, Estimated >60 >60 mL/min    Comment: (NOTE) Calculated using the CKD-EPI Creatinine Equation (2021)    Anion gap 11 5 - 15    Comment: Performed at [facility], [address], [place], [place] [address]  Magnesium     Status: Ellis   Collection Time: 03/18/22  5:23 PM  Result Value Ref Range   Magnesium 2.4 1.7 - 2.4 mg/dL    Comment: Performed at [facility], [address] [place], [place] [address]  Resp panel by RT-PCR (RSV, Flu A&B, Covid) Anterior Nasal Swab     Status: Ellis   Collection Time: 03/18/22  8:22 PM   Specimen: Anterior Nasal Swab  Result Value Ref Range   SARS Coronavirus 2 by RT PCR NEGATIVE NEGATIVE    Comment: (NOTE) SARS-CoV-2 target nucleic acids are NOT DETECTED.  The SARS-CoV-2 RNA is generally detectable in upper respiratory specimens during the acute phase of infection. The lowest concentration of SARS-CoV-2 viral copies this assay can detect is 138 copies/mL. A negative result does not preclude SARS-Cov-2 infection and should not be used as the sole basis for treatment or other patient management decisions.  A negative result may occur with  improper specimen collection/handling, submission of specimen other than nasopharyngeal swab, presence of viral mutation(s) within the areas targeted by this assay, and inadequate number of viral copies(<138 copies/mL). A negative result must be combined with clinical observations, patient history, and epidemiological information. The expected result is Negative.  Fact Sheet for Patients:  EntrepreneurPulse.com.au  Fact Sheet for Healthcare Providers:  IncredibleEmployment.be  This test is no t yet approved or cleared by the [place] FDA and  has been authorized for detection and/or diagnosis of SARS-CoV-2 by FDA under an Emergency Use Authorization (EUA). This EUA will remain  in effect (meaning this test can be used) for the duration of the COVID-19 declaration under Section 564(b)(1) of the Act, 21 U.S.C.section 360bbb-3(b)(1), unless the authorization is terminated  or revoked sooner.       Influenza A by PCR NEGATIVE NEGATIVE   Influenza B by PCR NEGATIVE NEGATIVE    Comment: (NOTE) The Xpert Xpress SARS-CoV-2/FLU/RSV plus assay is intended as an aid in the diagnosis of influenza from Nasopharyngeal swab specimens and should not be used as a sole basis for treatment. Nasal washings and aspirates are unacceptable for Xpert Xpress SARS-CoV-2/FLU/RSV testing.  Fact Sheet for Patients: EntrepreneurPulse.com.au  Fact Sheet for Healthcare Providers: IncredibleEmployment.be  This test is not yet approved or cleared by the [place] FDA and has been authorized for detection and/or diagnosis of SARS-CoV-2 by FDA under an Emergency Use Authorization (EUA). This EUA will remain  in effect (meaning this test can be used) for the duration of the COVID-19 declaration under Section 564(b)(1) of the Act, 21 U.S.C. section 360bbb-3(b)(1), unless the authorization is  terminated or revoked.     Resp Syncytial Virus by PCR NEGATIVE NEGATIVE    Comment: (NOTE) Fact Sheet for Patients: EntrepreneurPulse.com.au  Fact Sheet for Healthcare Providers: IncredibleEmployment.be  This test is not yet approved or cleared by the [place] FDA and has been authorized for detection and/or diagnosis of SARS-CoV-2 by FDA under an Emergency Use Authorization (EUA). This EUA will remain in effect (meaning this test can be used) for the duration of the COVID-19 declaration under Section 564(b)(1) of the Act, 21 U.S.C. section 360bbb-3(b)(1), unless the authorization is terminated or revoked.  Performed at [facility], [address] [place], [place] [address]   Ethanol     Status: Ellis   Collection Time: 03/18/22  9:16 PM  Result Value Ref Range   Alcohol, Ethyl (B) <10 <10 mg/dL    Comment: (NOTE) Lowest detectable limit for serum alcohol is 10 mg/dL.  For medical purposes only. Performed at [facility], [address] [place], [place] [id number]   Basic metabolic panel     Status: Abnormal   Collection Time: 03/19/22  3:25 AM  Result Value Ref Range   Sodium 135 135 - 145 mmol/L   Potassium 2.8 (L) 3.5 - 5.1 mmol/L   Chloride 99 98 - 111 mmol/L   CO2 28 22 - 32 mmol/L   Glucose, Bld 107 (H) 70 - 99 mg/dL    Comment: Glucose reference range applies only to samples taken after fasting for at least 8 hours.   BUN 20 6 - 20 mg/dL   Creatinine, Ser 0.85 0.44 - 1.00 mg/dL   Calcium 8.7 (L) 8.9 - 10.3 mg/dL   GFR, Estimated >60 >60 mL/min    Comment: (NOTE) Calculated using the CKD-EPI Creatinine Equation (2021)    Anion gap 8 5 - 15    Comment: Performed at [facility], [address] [place], [place] [address]    Blood Alcohol level:  Lab Results  Component Value Date   [facility] <10 03/18/2022   ETH <10 12/10/2018    Physical Findings: AIMS:  , ,  ,  ,    CIWA:    COWS:      Musculoskeletal: Strength & Muscle Tone: within normal limits Gait & Station: normal Patient leans: N/A  Psychiatric Specialty Exam:  Presentation  General Appearance:  Fairly Groomed  Eye Contact: Absent  Speech: Clear and Coherent  Speech Volume: Normal  Handedness:No data recorded  Mood and Affect  Mood: Dysphoric  Affect: Blunt; Flat (odd)   Thought Process  Thought Processes: Disorganized  Descriptions of Associations:Intact  Orientation:Full (Time, Place and Person)  Thought Content:Illogical  History of Schizophrenia/Schizoaffective disorder:No  Duration of Psychotic Symptoms:Less than six months  Hallucinations:Hallucinations: Visual; Auditory Description of Auditory Hallucinations: "I just hear things; they are talking to me now" Description of Visual Hallucinations: "I see men molesting my daughter"  Ideas of Reference:Delusions; Percusatory  Suicidal Thoughts:Suicidal Thoughts: No  Homicidal Thoughts:Homicidal Thoughts: No   Sensorium  Memory: Immediate Fair  Judgment: Fair  Insight: Fair   Materials engineer: Fair  Attention Span: Fair  Recall: [employer] of Knowledge: Fair  Language: Fair   Psychomotor Activity  Psychomotor Activity: Psychomotor Activity: Normal   Assets  Assets: Armed forces logistics/support/administrative officer; Desire for Improvement; Housing; Social Support; Resilience;  Physical Health   Sleep  Sleep: Sleep: Poor    Physical Exam: Physical Exam Vitals and nursing note reviewed.  HENT:     Head: Normocephalic.     Nose: No congestion or rhinorrhea.  Eyes:     General:        Right eye: No discharge.        Left eye: No discharge.  Cardiovascular:     Rate and Rhythm: Normal rate.  Pulmonary:     Effort: Pulmonary effort is normal.  Musculoskeletal:        General: Normal range of motion.     Cervical back: Normal range of motion.  Skin:    General: Skin is dry.  Neurological:      Mental Status: She is alert and oriented to person, place, and time.  Psychiatric:        Attention and Perception: She is inattentive.        Mood and Affect: Affect is blunt.        Speech: Speech normal.        Behavior: Behavior is cooperative.        Thought Content: Thought content is paranoid and delusional.        Judgment: Judgment normal.    Review of Systems  HENT: Negative.    Eyes: Negative.   Respiratory: Negative.    Psychiatric/Behavioral:  Positive for hallucinations.    Blood pressure 104/78, pulse 76, temperature 98.2 F (36.8 C), temperature source Oral, resp. rate 17, height 5' (1.524 m), weight 99.8 kg, SpO2 97 %. Body mass index is 42.97 kg/m.  Treatment Plan Summary: Patient continues to meet criteria for inpatient psychiatric hospitalization once potassium is repleted. No bed offers at this time. Patient agreeable to re-start Zyprexa.   Sherlon Handing, NP 03/19/2022, 1:46 PM

## 2022-03-19 NOTE — ED Notes (Signed)
Pt taking shower 

## 2022-03-19 NOTE — ED Notes (Signed)
Pt given breakfast tray

## 2022-03-19 NOTE — ED Notes (Signed)
Ivc /psych inpatient admission when medically cleared

## 2022-03-19 NOTE — ED Notes (Signed)
Assumed care of patient. Patient is asleep in recliner.

## 2022-03-19 NOTE — BH Assessment (Signed)
Patient is to be admitted to [facility] BMU tomm (Thursday) 03/20/22 by Dr. Weber Cooks.  Attending Physician will be Dr.  Weber Cooks .   Patient has been assigned to room 320, by [place], Nicole Kindred.    ER staff is aware of the admission: Lattie Haw, ER Secretary   Dr. Archie Balboa, ER MD  Thedore Mins, Patient's Nurse

## 2022-03-19 NOTE — ED Notes (Signed)
Pt out of shower back in chair, visitor on their way to see pt.

## 2022-03-20 LAB — BASIC METABOLIC PANEL
Anion gap: 7 (ref 5–15)
Anion gap: 8 (ref 5–15)
BUN: 22 mg/dL — ABNORMAL HIGH (ref 6–20)
BUN: 20 mg/dL (ref 6–20)
CO2: 29 mmol/L (ref 22–32)
CO2: 29 mmol/L (ref 22–32)
Calcium: 8.9 mg/dL (ref 8.9–10.3)
Calcium: 8.7 mg/dL — ABNORMAL LOW (ref 8.9–10.3)
Chloride: 101 mmol/L (ref 98–111)
Chloride: 99 mmol/L (ref 98–111)
Creatinine, Ser: 0.87 mg/dL (ref 0.44–1.00)
Creatinine, Ser: 0.87 mg/dL (ref 0.44–1.00)
GFR, Estimated: >60 mL/min (ref >60)
GFR, Estimated: >60 mL/min (ref >60)
Glucose, Bld: 100 mg/dL — ABNORMAL HIGH (ref 70–99)
Glucose, Bld: 99 mg/dL (ref 70–99)
Potassium: 3 mmol/L — ABNORMAL LOW (ref 3.5–5.1)
Potassium: 3 mmol/L — ABNORMAL LOW (ref 3.5–5.1)
Sodium: 137 mmol/L (ref 135–145)
Sodium: 136 mmol/L (ref 135–145)

## 2022-03-20 MED ORDER — CEFDINIR 300 MG PO CAPS
300.0000 mg | ORAL_CAPSULE | Freq: Two times a day (BID) | ORAL | Status: DC
  Administered 2022-03-20: 300 mg via ORAL
  Filled 2022-03-20: qty 1

## 2022-03-20 MED ORDER — POTASSIUM CHLORIDE CRYS ER 20 MEQ PO TBCR
40.0000 meq | EXTENDED_RELEASE_TABLET | Freq: Once | ORAL | Status: AC
  Administered 2022-03-20: 40 meq via ORAL
  Filled 2022-03-20: qty 2

## 2022-03-20 MED ORDER — POTASSIUM CHLORIDE CRYS ER 20 MEQ PO TBCR: 40.0000 meq | EXTENDED_RELEASE_TABLET | Freq: Every day | ORAL | Status: DC

## 2022-03-20 NOTE — ED Notes (Signed)
EDT asked pt "Would you like a snack tonight or would you like to wait until morning?" EDT listed off different options available at the ED for snack. Pt shook her head and stated "I don't want anything tonight". Pt did state she wanted a fresh milk to go with her cookies from lunch. Milk provided and old lunch tray and cups disposed of per pts request.

## 2022-03-20 NOTE — ED Notes (Signed)
IVC/Plan to admit to BMU when medically clear

## 2022-03-20 NOTE — ED Notes (Signed)
ivc/admitted to [facility] BMU on 03/20/22 by Dr.Clapacs.Marland KitchenMarland Kitchen

## 2022-03-20 NOTE — ED Provider Notes (Signed)
Emergency Medicine Observation Re-evaluation Note  Rita Ellis is a 38 y.o. female, seen on rounds today.  Pt initially presented to the ED for complaints of Mental Health Evaluation    Physical Exam  BP 101/76 (BP Location: Left Wrist)   Pulse (!) 56   Temp 98.5 F (36.9 C) (Oral)   Resp 16   Ht 5' (1.524 m)   Wt 99.8 kg   SpO2 100%   BMI 42.97 kg/m  Physical Exam General: Resting comfortably in hallway bed Lungs: No increased work of breathing Psych: Calm, no agitation  ED Course / MDM  EKG:EKG Interpretation  Date/Time:  Tuesday March 18 2022 18:04:29 EDT Ventricular Rate:  80 PR Interval:  146 QRS Duration: 84 QT Interval:  398 QTC Calculation: 459 R Axis:   79 Text Interpretation: Normal sinus rhythm with sinus arrhythmia Nonspecific T wave abnormality Abnormal ECG When compared with ECG of 04-Oct-2018 18:14, Vent. rate has decreased BY  44 BPM Questionable change in QRS axis Nonspecific T wave abnormality, worse in Anterior leads Confirmed by UNCONFIRMED, DOCTOR ([id number]), editor Antonieta Iba [phone]) on 03/19/2022 7:41:05 AM  I have reviewed the labs performed to date as well as medications administered while in observation.  Recent changes in the last 24 hours include patient has been seen by psychiatry and plan for admission  Plan  Current plan is for inpatient psych admission  Patient is under IVC.    Rada Hay, MD 03/20/22 [phone]

## 2022-03-20 NOTE — ED Notes (Signed)
Hospital meal provided, pt tolerated w/o complaints.  Waste discarded appropriately.  

## 2022-03-21 ENCOUNTER — Other Ambulatory Visit: Payer: Self-pay

## 2022-03-21 ENCOUNTER — Encounter: Payer: Self-pay | Admitting: Psychiatry

## 2022-03-21 ENCOUNTER — Inpatient Hospital Stay
Admission: AD | Admit: 2022-03-21 | Discharge: 2022-03-24 | DRG: 885 | Disposition: A | Discharge status: Home / Self Care | Payer: 59 | Source: Intra-hospital | Attending: Psychiatry | Admitting: Psychiatry

## 2022-03-21 DIAGNOSIS — N390 Urinary tract infection, site not specified: Secondary | ICD-10-CM | POA: Diagnosis present

## 2022-03-21 DIAGNOSIS — F259 Schizoaffective disorder, unspecified: Principal | ICD-10-CM | POA: Diagnosis present

## 2022-03-21 DIAGNOSIS — F17200 Nicotine dependence, unspecified, uncomplicated: Secondary | ICD-10-CM | POA: Diagnosis present

## 2022-03-21 DIAGNOSIS — F250 Schizoaffective disorder, bipolar type: Secondary | ICD-10-CM

## 2022-03-21 DIAGNOSIS — F29 Unspecified psychosis not due to a substance or known physiological condition: Principal | ICD-10-CM | POA: Diagnosis present

## 2022-03-21 DIAGNOSIS — E876 Hypokalemia: Secondary | ICD-10-CM | POA: Insufficient documentation

## 2022-03-21 MED ORDER — HALOPERIDOL 5 MG PO TABS
5.0000 mg | ORAL_TABLET | Freq: Every day | ORAL | Status: DC
  Administered 2022-03-21 – 2022-03-23 (×3): 5 mg via ORAL
  Filled 2022-03-21 (×3): qty 1

## 2022-03-21 MED ORDER — DIPHENHYDRAMINE HCL 50 MG/ML IJ SOLN: 50.0000 mg | Freq: Two times a day (BID) | INTRAMUSCULAR | Status: AC | PRN

## 2022-03-21 MED ORDER — LORAZEPAM 2 MG PO TABS: 2.0000 mg | ORAL_TABLET | Freq: Two times a day (BID) | ORAL | Status: AC | PRN

## 2022-03-21 MED ORDER — OLANZAPINE 10 MG PO TABS: 10.0000 mg | ORAL_TABLET | Freq: Every day | ORAL | Status: DC

## 2022-03-21 MED ORDER — MAGNESIUM HYDROXIDE 400 MG/5ML PO SUSP: 30.0000 mL | Freq: Every day | ORAL | Status: DC | PRN

## 2022-03-21 MED ORDER — DIPHENHYDRAMINE HCL 25 MG PO CAPS: 50.0000 mg | ORAL_CAPSULE | Freq: Two times a day (BID) | ORAL | Status: AC | PRN

## 2022-03-21 MED ORDER — HALOPERIDOL LACTATE 5 MG/ML IJ SOLN: 5.0000 mg | Freq: Two times a day (BID) | INTRAMUSCULAR | Status: AC | PRN

## 2022-03-21 MED ORDER — HALOPERIDOL 5 MG PO TABS: 5.0000 mg | ORAL_TABLET | Freq: Two times a day (BID) | ORAL | Status: AC | PRN

## 2022-03-21 MED ORDER — NICOTINE 14 MG/24HR TD PT24
14.0000 mg | MEDICATED_PATCH | Freq: Every day | TRANSDERMAL | Status: DC
  Administered 2022-03-21 – 2022-03-24 (×4): 14 mg via TRANSDERMAL
  Filled 2022-03-21 (×4): qty 1

## 2022-03-21 MED ORDER — POTASSIUM CHLORIDE CRYS ER 20 MEQ PO TBCR
40.0000 meq | EXTENDED_RELEASE_TABLET | Freq: Every day | ORAL | Status: DC
  Administered 2022-03-21 – 2022-03-24 (×4): 40 meq via ORAL
  Filled 2022-03-21 (×4): qty 2

## 2022-03-21 MED ORDER — LORAZEPAM 2 MG/ML IJ SOLN: 2.0000 mg | Freq: Two times a day (BID) | INTRAMUSCULAR | Status: AC | PRN

## 2022-03-21 MED ORDER — ACETAMINOPHEN 325 MG PO TABS: 650.0000 mg | ORAL_TABLET | Freq: Four times a day (QID) | ORAL | Status: DC | PRN

## 2022-03-21 MED ORDER — CEFDINIR 300 MG PO CAPS
300.0000 mg | ORAL_CAPSULE | Freq: Two times a day (BID) | ORAL | Status: DC
  Administered 2022-03-21 – 2022-03-24 (×7): 300 mg via ORAL
  Filled 2022-03-21 (×7): qty 1

## 2022-03-21 MED ORDER — ALUM & MAG HYDROXIDE-SIMETH 200-200-20 MG/5ML PO SUSP
30.0000 mL | ORAL | Status: DC | PRN
  Administered 2022-03-22 – 2022-03-23 (×2): 30 mL via ORAL
  Filled 2022-03-21 (×3): qty 30

## 2022-03-21 NOTE — Tx Team (Signed)
Initial Treatment Plan 03/21/2022 2:51 AM Rita Ellis [id number]    PATIENT STRESSORS: Medication change or noncompliance     PATIENT STRENGTHS: General fund of knowledge  Motivation for treatment/growth  Supportive family/friends    PATIENT IDENTIFIED PROBLEMS: Medication non compliance  "I hear voices of mind reader"                   DISCHARGE CRITERIA:  Improved stabilization in mood, thinking, and/or behavior Motivation to continue treatment in a less acute level of care Need for constant or close observation no longer present Verbal commitment to aftercare and medication compliance  PRELIMINARY DISCHARGE PLAN: Return to previous living arrangement Return to previous work or school arrangements  PATIENT/FAMILY INVOLVEMENT: This treatment plan has been presented to and reviewed with the patient, Rita Ellis.  The patient and family have been given the opportunity to ask questions and make suggestions.  Wylie Hail, RN 03/21/2022, 2:51 AM

## 2022-03-21 NOTE — Progress Notes (Signed)
D- Patient alert and oriented x 3. Affect anxious/mood disorganized. Denies SI/ HI/ AVH. She denies pain. She states she "I want a cigarette bad". Nicotine 14 g patch ordered and applied. Verbalizes effective. She also complains of "acid stomach". PRN Maalox 14ml admin for complaints of with fair results. A- Scheduled medications administered to patient, per MD orders. Support and encouragement provided.  Routine safety checks conducted every 15 minutes.  Patient informed to notify staff with problems or concerns and verbalizes understanding. R- No adverse drug reactions noted. Patient compliant with medications and treatment plan. Patient receptive, calm, and cooperative and interacts well with others on the unit. Patient contracts for safety and remains safe on the unit at this time.

## 2022-03-21 NOTE — Progress Notes (Signed)
Patient admitted to unit with complaints of hearing voices of mind reader. Reports feeling funny in the head all the time. States she is non compliant with medications and have been off for awhile. Med re-started in ED. Patient denies any SI, HI, Vh, just endorses auditory.  No command voices.  Oriented patient to room and unit, skin and contraband search completed and witnessed by [place]. No skin issues noted, no contraband found. Fluid and nutrition offered and accepted. Patient in room at present in no distress. Will continue to monitor q 15 min for safety.

## 2022-03-21 NOTE — BH IP Treatment Plan (Signed)
Interdisciplinary Treatment and Diagnostic Plan Update  03/21/2022 Time of Session: 8:58AM Rita Ellis MRN: [id number]  Principal Diagnosis: Psychosis [facility])  Secondary Diagnoses: Principal Problem:   Psychosis ([place])   Current Medications:  Current Facility-Administered Medications  Medication Dose Route Frequency Provider Last Rate Last Admin   acetaminophen (TYLENOL) tablet 650 mg  650 mg Oral Q6H PRN Sherlon Handing, NP       alum & mag hydroxide-simeth (MAALOX/MYLANTA) 200-200-20 MG/5ML suspension 30 mL  30 mL Oral Q4H PRN Sherlon Handing, NP       cefdinir (OMNICEF) capsule 300 mg  300 mg Oral Q12H Waldon Merl F, NP   300 mg at 03/21/22 [id number]   diphenhydrAMINE (BENADRYL) capsule 50 mg  50 mg Oral BID PRN Sherlon Handing, NP       Or   diphenhydrAMINE (BENADRYL) injection 50 mg  50 mg Intramuscular BID PRN Waldon Merl F, NP       haloperidol (HALDOL) tablet 5 mg  5 mg Oral BID PRN Sherlon Handing, NP       Or   haloperidol lactate (HALDOL) injection 5 mg  5 mg Intramuscular BID PRN Sherlon Handing, NP       LORazepam (ATIVAN) tablet 2 mg  2 mg Oral BID PRN Sherlon Handing, NP       Or   LORazepam (ATIVAN) injection 2 mg  2 mg Intramuscular BID PRN Sherlon Handing, NP       magnesium hydroxide (MILK OF MAGNESIA) suspension 30 mL  30 mL Oral Daily PRN Waldon Merl F, NP       nicotine (NICODERM CQ - dosed in mg/24 hours) patch 14 mg  14 mg Transdermal Daily Clapacs, John T, MD   14 mg at 03/21/22 0958   OLANZapine (ZYPREXA) tablet 10 mg  10 mg Oral QHS Waldon Merl F, NP       potassium chloride SA (KLOR-CON M) CR tablet 40 mEq  40 mEq Oral Daily Waldon Merl F, NP   40 mEq at 03/21/22 [id number]   PTA Medications: Medications Prior to Admission  Medication Sig Dispense Refill Last Dose   famotidine (PEPCID) 20 MG tablet Take 1 tablet (20 mg total) by mouth 2 (two) times daily. (Patient not taking: Reported on 03/18/2022) 60 tablet 0     hydrOXYzine (ATARAX/VISTARIL) 50 MG tablet Take 1 tablet (50 mg total) by mouth 3 (three) times daily as needed for anxiety. (Patient not taking: Reported on 03/18/2022) 30 tablet 1    ibuprofen (ADVIL) 800 MG tablet Take 1 tablet (800 mg total) by mouth every 8 (eight) hours as needed for moderate pain. (Patient not taking: Reported on 03/18/2022) 15 tablet 0    methocarbamol (ROBAXIN) 500 MG tablet Take 1 tablet (500 mg total) by mouth every 8 (eight) hours as needed for muscle spasms. (Patient not taking: Reported on 03/18/2022) 15 tablet 0    OLANZapine zydis (ZYPREXA) 10 MG disintegrating tablet Take 1 tablet (10 mg total) by mouth at bedtime. (Patient not taking: Reported on 03/18/2022) 30 tablet 1     Patient Stressors: Medication change or noncompliance    Patient Strengths: General fund of knowledge  Motivation for treatment/growth  Supportive family/friends   Treatment Modalities: Medication Management, Group therapy, Case management,  1 to 1 session with clinician, Psychoeducation, Recreational therapy.   Physician Treatment Plan for Primary Diagnosis: Psychosis ([place]) Long Term Goal(s):     Short Term Goals:    Medication Management:  Evaluate patient's response, side effects, and tolerance of medication regimen.  Therapeutic Interventions: 1 to 1 sessions, Unit Group sessions and Medication administration.  Evaluation of Outcomes: Progressing  Physician Treatment Plan for Secondary Diagnosis: Principal Problem:   Psychosis ([place])  Long Term Goal(s):     Short Term Goals:       Medication Management: Evaluate patient's response, side effects, and tolerance of medication regimen.  Therapeutic Interventions: 1 to 1 sessions, Unit Group sessions and Medication administration.  Evaluation of Outcomes: Progressing   RN Treatment Plan for Primary Diagnosis: Psychosis ([place]) Long Term Goal(s): Knowledge of disease and therapeutic regimen to maintain health will improve  Short  Term Goals: Ability to demonstrate self-control, Ability to participate in decision making will improve, Ability to verbalize feelings will improve, Ability to disclose and discuss suicidal ideas, Ability to identify and develop effective coping behaviors will improve, and Compliance with prescribed medications will improve  Medication Management: RN will administer medications as ordered by provider, will assess and evaluate patient's response and provide education to patient for prescribed medication. RN will report any adverse and/or side effects to prescribing provider.  Therapeutic Interventions: 1 on 1 counseling sessions, Psychoeducation, Medication administration, Evaluate responses to treatment, Monitor vital signs and CBGs as ordered, Perform/monitor CIWA, COWS, AIMS and Fall Risk screenings as ordered, Perform wound care treatments as ordered.  Evaluation of Outcomes: Progressing   LCSW Treatment Plan for Primary Diagnosis: Psychosis ([place]) Long Term Goal(s): Safe transition to appropriate next level of care at discharge, Engage patient in therapeutic group addressing interpersonal concerns.  Short Term Goals: Engage patient in aftercare planning with referrals and resources, Increase social support, Increase ability to appropriately verbalize feelings, Increase emotional regulation, Facilitate acceptance of mental health diagnosis and concerns, and Increase skills for wellness and recovery  Therapeutic Interventions: Assess for all discharge needs, 1 to 1 time with Social worker, Explore available resources and support systems, Assess for adequacy in community support network, Educate family and significant other(s) on suicide prevention, Complete Psychosocial Assessment, Interpersonal group therapy.  Evaluation of Outcomes: Progressing   Progress in Treatment: Attending groups: Yes. Participating in groups: Yes. Taking medication as prescribed: Yes. Toleration medication:  Yes. Family/Significant other contact made: No, will contact:  once permission is given Patient understands diagnosis: Yes. Discussing patient identified problems/goals with staff: Yes. Medical problems stabilized or resolved: Yes. Denies suicidal/homicidal ideation: Yes. Issues/concerns per patient self-inventory: No. Other: none  New problem(s) identified: No, Describe:  none  New Short Term/Long Term Goal(s): detox, elimination of symptoms of psychosis, medication management for mood stabilization; elimination of SI thoughts; development of comprehensive mental wellness/sobriety plan.   Patient Goals:  "getting myself straight and stop worrying about stuff"  Discharge Plan or Barriers: CSW to assist in the development of appropriate discharge plans.    Reason for Continuation of Hospitalization: Anxiety Depression Medication stabilization  Estimated Length of Stay:  1-7 days  Last 3 [place] Suicide Severity Risk Score: Flowsheet Row Admission (Current) from 03/21/2022 in [place] ED from 03/18/2022 in [facility] Emergency Department [facility] Admission (Discharged) from 12/11/2018 in [place] No Risk No Risk No Risk       Last PHQ 2/9 Scores:     No data to display          Scribe for Treatment Team: Rozann Lesches, Marlinda Mike 03/21/2022 12:45 PM

## 2022-03-21 NOTE — BHH Counselor (Signed)
PSA attempted.  Another attempt is needed.  Assunta Curtis, MSW, LCSW 03/21/2022 4:32 PM

## 2022-03-21 NOTE — BHH Suicide Risk Assessment (Signed)
[facility] Admission Suicide Risk Assessment   Nursing information obtained from:    Demographic factors:  Adolescent or young adult Current Mental Status:  NA Loss Factors:  NA Historical Factors:  Impulsivity Risk Reduction Factors:  Positive social support  Total Time spent with patient: 45 minutes Principal Problem: Psychosis ([place]) Diagnosis:  Principal Problem:   Psychosis ([place]) Active Problems:   Schizoaffective disorder ([place])   Nicotine dependence   Urinary tract infection  Subjective Data: Patient seen and chart reviewed.  37 year old woman well-known to [place] with a history of recurrent psychotic disorder.  Patient presented to the hospital because of several days of disorganized behavior.  She denies having any suicidal or homicidal ideation.  Denies having done anything to try to hurt herself.  She was feeling paranoid frightened and agitated and not sleeping well and has not been on medicine  Continued Clinical Symptoms:  Alcohol Use Disorder Identification Test Final Score (AUDIT): 0 The "Alcohol Use Disorders Identification Test", Guidelines for Use in Primary Care, Second Edition.  World Pharmacologist [facility]). Score between 0-7:  no or low risk or alcohol related problems. Score between 8-15:  moderate risk of alcohol related problems. Score between 16-19:  high risk of alcohol related problems. Score 20 or above:  warrants further diagnostic evaluation for alcohol dependence and treatment.   CLINICAL FACTORS:   Schizophrenia:   Paranoid or undifferentiated type   Musculoskeletal: Strength & Muscle Tone: within normal limits Gait & Station: normal Patient leans: N/A  Psychiatric Specialty Exam:  Presentation  General Appearance:  Fairly Groomed  Eye Contact: Absent  Speech: Clear and Coherent  Speech Volume: Normal  Handedness:No data recorded  Mood and Affect  Mood: Dysphoric  Affect: Blunt; Flat (odd)   Thought Process  Thought  Processes: Disorganized  Descriptions of Associations:Intact  Orientation:Full (Time, Place and Person)  Thought Content:Illogical  History of Schizophrenia/Schizoaffective disorder:No  Duration of Psychotic Symptoms:Less than six months  Hallucinations:No data recorded Ideas of Reference:Delusions; Percusatory  Suicidal Thoughts:No data recorded Homicidal Thoughts:No data recorded  Sensorium  Memory: Immediate Fair  Judgment: Fair  Insight: Fair   Materials engineer: Fair  Attention Span: Fair  Recall: [employer] of Knowledge: Fair  Language: Fair   Psychomotor Activity  Psychomotor Activity:No data recorded  Assets  Assets: Communication Skills; Desire for Improvement; Housing; Social Support; Resilience; Physical Health   Sleep  Sleep:No data recorded   Physical Exam: Physical Exam Vitals and nursing note reviewed.  Constitutional:      Appearance: Normal appearance.  HENT:     Head: Normocephalic and atraumatic.     Mouth/Throat:     Pharynx: Oropharynx is clear.  Eyes:     Pupils: Pupils are equal, round, and reactive to light.  Cardiovascular:     Rate and Rhythm: Normal rate and regular rhythm.  Pulmonary:     Effort: Pulmonary effort is normal.     Breath sounds: Normal breath sounds.  Abdominal:     General: Abdomen is flat.     Palpations: Abdomen is soft.  Musculoskeletal:        General: Normal range of motion.  Skin:    General: Skin is warm and dry.  Neurological:     General: No focal deficit present.     Mental Status: She is alert. Mental status is at baseline.  Psychiatric:        Attention and Perception: Attention normal. She perceives auditory hallucinations.  Mood and Affect: Mood is anxious and depressed.        Speech: Speech is tangential.        Behavior: Behavior is withdrawn.        Thought Content: Thought content is paranoid and delusional.        Cognition and Memory: Memory  is impaired.        Judgment: Judgment is impulsive.    Review of Systems  Constitutional: Negative.   HENT: Negative.    Eyes: Negative.   Respiratory: Negative.    Cardiovascular: Negative.   Gastrointestinal: Negative.   Musculoskeletal: Negative.   Skin: Negative.   Neurological: Negative.   Psychiatric/Behavioral:  Positive for depression and hallucinations. Negative for substance abuse and suicidal ideas. The patient is nervous/anxious and has insomnia.    Blood pressure (!) 98/57, pulse 65, temperature 98.3 F (36.8 C), temperature source Oral, resp. rate 16, height 5' (1.524 m), weight 90.7 kg, SpO2 100 %. Body mass index is 39.06 kg/m.   COGNITIVE FEATURES THAT CONTRIBUTE TO RISK:  Loss of executive function    SUICIDE RISK:   Minimal: No identifiable suicidal ideation.  Patients presenting with no risk factors but with morbid ruminations; may be classified as minimal risk based on the severity of the depressive symptoms  PLAN OF CARE: Restart medication for chronic mental illness.  Continue 15-minute checks.  Patient met with treatment team today.  Ongoing assessment of dangerousness prior to discharge  I certify that inpatient services furnished can reasonably be expected to improve the patient's condition.   Alethia Berthold, MD 03/21/2022, 2:47 PM

## 2022-03-21 NOTE — Group Note (Signed)
Recreation Therapy Group Note   Group Topic:Emotion Expression  Group Date: 03/21/2022 Start Time: 1000 End Time: 1030 Facilitators: Vilma Prader, LRT, CTRS Location:  Craft Room  Group Description: Gratitude Journaling. Patients and LRT discussed what gratitude means, how we can express it and what it means to [place], personally. LRT gave an education handout on the definition of gratitude that also gave different examples of gratitude exercises that they could try. One of the examples was "Gratitude Letter", which prompted you to write a letter to someone you appreciate. LRT played soft music while everyone wrote their letter. Once letter was completed, LRT encouraged people to read their letter, if they wanted to, or share who they wrote it to, at minimum. LRT and pts processed how showing gratitude towards themselves, and others can be applied to everyday life post-discharge.   Affect/Mood: Appropriate   Participation Level: Active and Engaged   Participation Quality: Independent   Behavior: Appropriate, Calm, Cooperative, and Eager   Speech/Thought Process: Coherent   Insight: Good   Judgement: Good   Modes of Intervention: Activity and Guided Discussion   Patient Response to Interventions:  Attentive, Engaged, Interested , and Receptive   Education Outcome:  Acknowledges education   Clinical Observations/Individualized Feedback: Rita Ellis was active in their participation of session activities and group discussion. Pt identified that she wrote her letter to her parents, kids, and sister. She shared: "I am thankful for them always being there and for making me who I am". Pt shared that this exercise "came full circle and it was nice to be able to include everyone in her family."  Plan: Continue to engage patient in RT group sessions 2-3x/week.   Vilma Prader, LRT, CTRS 03/21/2022 10:52 AM
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
## 2022-03-22 DIAGNOSIS — F250 Schizoaffective disorder, bipolar type: Secondary | ICD-10-CM | POA: Diagnosis not present

## 2022-03-22 LAB — LIPID PANEL
Cholesterol: 160 mg/dL (ref 0–200)
HDL: 31 mg/dL — ABNORMAL LOW (ref >40)
LDL Cholesterol: 114 mg/dL — ABNORMAL HIGH (ref 0–99)
Total CHOL/HDL Ratio: 5.2 RATIO
Triglycerides: 73 mg/dL (ref <150)
VLDL: 15 mg/dL (ref 0–40)

## 2022-03-22 MED ORDER — PANTOPRAZOLE SODIUM 40 MG PO TBEC
40.0000 mg | DELAYED_RELEASE_TABLET | Freq: Two times a day (BID) | ORAL | Status: DC
  Filled 2022-03-22 (×3): qty 1
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
## 2022-03-22 NOTE — Group Note (Signed)
AA/NA Group     Group Date: 03/22/2022 Start Time: [id number] End Time: 1445     Type of Therapy and Topic:  Group Therapy:    Participation Level:  Did Not Attend   Description of Group: AA/NA providers held group with patients to address SUD.   Summary of Patient Progress:     X   Therapeutic Modalities:    Maryjane Hurter 03/22/2022  2:53 PM

## 2022-03-22 NOTE — Progress Notes (Signed)
[name] Progress Note  03/22/2022 1:06 PM Rita Ellis  MRN:  [id number] Subjective: Patient seen for follow-up.  The patient says she is feeling much better.  Slept well last night.  Denies hallucinations.  Denies suicidal ideation.  Patient is dressed neatly making good eye contact good hygiene and appropriate interaction and speech style.  No talk about suicide or evidence of dangerousness.  Had appropriate questions she wanted to ask about her lab values.  Lipid panel has come back and we reviewed that it is largely very good with the possible exception of the HDL.  HIV test and hemoglobin A1c still pending with the lab. Principal Problem: Psychosis ([place]) Diagnosis: Principal Problem:   Psychosis ([place]) Active Problems:   Schizoaffective disorder ([place])   Nicotine dependence   Urinary tract infection   Hypokalemia  Total Time spent with patient: 30 minutes  Past Psychiatric History: Past history of schizoaffective disorder  Past Medical History: History reviewed. No pertinent past medical history.  Past Surgical History:  Procedure Laterality Date   CESAREAN SECTION     Family History: History reviewed. No pertinent family history. Family Psychiatric  History: See previous Social History:  Social History   Substance and Sexual Activity  Alcohol Use Not Currently     Social History   Substance and Sexual Activity  Drug Use Never    Social History   Socioeconomic History   Marital status: Single    Spouse name: Not on file   Number of children: Not on file   Years of education: Not on file   Highest education level: Not on file  Occupational History   Not on file  Tobacco Use   Smoking status: Every Day   Smokeless tobacco: Never  Vaping Use   Vaping Use: Never used  Substance and Sexual Activity   Alcohol use: Not Currently   Drug use: Never   Sexual activity: Not on file  Other Topics Concern   Not on file  Social History Narrative   Not on file   Social  Determinants of Health   Financial Resource Strain: Not on file  Food Insecurity: Patient Declined (03/21/2022)   Hunger Vital Sign    Worried About Running Out of Food in the Last Year: Patient declined    [place] in the Last Year: Patient declined  Transportation Needs: No Transportation Needs (03/21/2022)   PRAPARE - Hydrologist (Medical): No    Lack of Transportation (Non-Medical): No  Physical Activity: Not on file  Stress: Not on file  Social Connections: Not on file   Additional Social History:                         Sleep: Fair  Appetite:  Fair  Current Medications: Current Facility-Administered Medications  Medication Dose Route Frequency Provider Last Rate Last Admin   acetaminophen (TYLENOL) tablet 650 mg  650 mg Oral Q6H PRN Sherlon Handing, NP       alum & mag hydroxide-simeth (MAALOX/MYLANTA) 200-200-20 MG/5ML suspension 30 mL  30 mL Oral Q4H PRN Waldon Merl F, NP       cefdinir (OMNICEF) capsule 300 mg  300 mg Oral Q12H Waldon Merl F, NP   300 mg at 03/22/22 0930   haloperidol (HALDOL) tablet 5 mg  5 mg Oral QHS Amiri Tritch T, MD   5 mg at 03/21/22 2138   magnesium hydroxide (MILK OF MAGNESIA)  suspension 30 mL  30 mL Oral Daily PRN Waldon Merl F, NP       nicotine (NICODERM CQ - dosed in mg/24 hours) patch 14 mg  14 mg Transdermal Daily Teena Mangus, [name], MD   14 mg at 03/22/22 0931   pantoprazole (PROTONIX) EC tablet 40 mg  40 mg Oral BID Icey Tello T, MD       potassium chloride SA (KLOR-CON M) CR tablet 40 mEq  40 mEq Oral Daily Sherlon Handing, NP   40 mEq at 03/22/22 0930    Lab Results:  Results for orders placed or performed during the hospital encounter of 03/21/22 (from the past 48 hour(s))  Lipid panel     Status: Abnormal   Collection Time: 03/22/22  6:38 AM  Result Value Ref Range   Cholesterol 160 0 - 200 mg/dL   Triglycerides 73 <150 mg/dL   HDL 31 (L) >40 mg/dL   Total  CHOL/HDL Ratio 5.2 RATIO   VLDL 15 0 - 40 mg/dL   LDL Cholesterol 114 (H) 0 - 99 mg/dL    Comment:        Total Cholesterol/HDL:CHD Risk Coronary Heart Disease Risk Table                     Men   Women  1/2 Average Risk   3.4   3.3  Average Risk       5.0   4.4  2 X Average Risk   9.6   7.1  3 X Average Risk  23.4   11.0        Use the calculated Patient Ratio above and the CHD Risk Table to determine the patient's CHD Risk.        ATP III CLASSIFICATION (LDL):  <100     mg/dL   Optimal  100-129  mg/dL   Near or Above                    Optimal  130-159  mg/dL   Borderline  160-189  mg/dL   High  >190     mg/dL   Very High Performed at [facility], [address] [place], [place] [address]     Blood Alcohol level:  Lab Results  Component Value Date   [facility] <10 03/18/2022   ETH <10 [id number]    Metabolic Disorder Labs: Lab Results  Component Value Date   HGBA1C 5.8 (H) 12/14/2018   MPG 119.76 12/14/2018   No results found for: "PROLACTIN" Lab Results  Component Value Date   CHOL 160 03/22/2022   TRIG 73 03/22/2022   HDL 31 (L) 03/22/2022   CHOLHDL 5.2 03/22/2022   VLDL 15 03/22/2022   LDLCALC 114 (H) 03/22/2022    Physical Findings: AIMS:  , ,  ,  ,    CIWA:    COWS:     Musculoskeletal: Strength & Muscle Tone: within normal limits Gait & Station: normal Patient leans: N/A  Psychiatric Specialty Exam:  Presentation  General Appearance:  Fairly Groomed  Eye Contact: Absent  Speech: Clear and Coherent  Speech Volume: Normal  Handedness:No data recorded  Mood and Affect  Mood: Dysphoric  Affect: Blunt; Flat (odd)   Thought Process  Thought Processes: Disorganized  Descriptions of Associations:Intact  Orientation:Full (Time, Place and Person)  Thought Content:Illogical  History of Schizophrenia/Schizoaffective disorder:No  Duration of Psychotic Symptoms:Less than six months  Hallucinations:No data  recorded Ideas of Reference:Delusions; Percusatory  Suicidal Thoughts:No data recorded Homicidal Thoughts:No data recorded  Sensorium  Memory: Immediate Fair  Judgment: Fair  Insight: Fair   Materials engineer: Fair  Attention Span: Fair  Recall: [employer] of Knowledge: Fair  Language: Fair   Psychomotor Activity  Psychomotor Activity:No data recorded  Assets  Assets: Communication Skills; Desire for Improvement; Housing; Social Support; Resilience; Physical Health   Sleep  Sleep:No data recorded   Physical Exam: Physical Exam Vitals and nursing note reviewed.  Constitutional:      Appearance: Normal appearance.  HENT:     Head: Normocephalic and atraumatic.     Mouth/Throat:     Pharynx: Oropharynx is clear.  Eyes:     Pupils: Pupils are equal, round, and reactive to light.  Cardiovascular:     Rate and Rhythm: Normal rate and regular rhythm.  Pulmonary:     Effort: Pulmonary effort is normal.     Breath sounds: Normal breath sounds.  Abdominal:     General: Abdomen is flat.     Palpations: Abdomen is soft.  Musculoskeletal:        General: Normal range of motion.  Skin:    General: Skin is warm and dry.  Neurological:     General: No focal deficit present.     Mental Status: She is alert. Mental status is at baseline.  Psychiatric:        Attention and Perception: Attention normal.        Mood and Affect: Mood normal.        Speech: Speech normal.        Behavior: Behavior normal.        Thought Content: Thought content normal.        Cognition and Memory: Cognition normal.        Judgment: Judgment normal.    Review of Systems  Constitutional: Negative.   HENT: Negative.    Eyes: Negative.   Respiratory: Negative.    Cardiovascular: Negative.   Gastrointestinal: Negative.   Musculoskeletal: Negative.   Skin: Negative.   Neurological: Negative.   Psychiatric/Behavioral: Negative.     Blood pressure  109/86, pulse 80, temperature 98.6 F (37 C), temperature source Oral, resp. rate 18, height 5' (1.524 m), weight 90.7 kg, SpO2 100 %. Body mass index is 39.06 kg/m.   Treatment Plan Summary: Plan no change to medication.  If she is still doing well tomorrow I anticipate proposing that we restart her long-acting injectable medicine as we may be ready for discharge as early as Monday  Alethia Berthold, MD 03/22/2022, 1:06 PM

## 2022-03-22 NOTE — Progress Notes (Signed)
Patient is cooperative with treatment, she was pleasant on approach no new issues to report on shift at this time.

## 2022-03-22 NOTE — BHH Suicide Risk Assessment (Signed)
[place] INPATIENT:  Family/Significant Other Suicide Prevention Education  Suicide Prevention Education:  Contact Attempts: Cecia Dyment, mother, [phone] been identified by the patient as the family member/significant other with whom the patient will be residing, and identified as the person(s) who will aid the patient in the event of a mental health crisis.  With written consent from the patient, two attempts were made to provide suicide prevention education, prior to and/or following the patient's discharge.  We were unsuccessful in providing suicide prevention education.  A suicide education pamphlet was given to the patient to share with family/significant other.  Date and time of first attempt: 03/22/2022 at 11:18AM Date and time of second attempt: Second attempt is needed.  CSW left HIPAA compliant voicemail.  Rozann Lesches 03/22/2022, 11:17 AM

## 2022-03-22 NOTE — Group Note (Signed)
[facility] LCSW Group Therapy Note   Group Date: 03/22/2022 Start Time: [id number] End Time: 1411   Type of Therapy/Topic:  Group Therapy:  Emotion Regulation  Participation Level:  Active   Mood:  Description of Group:    The purpose of this group is to assist patients in learning to regulate negative emotions and experience positive emotions. Patients will be guided to discuss ways in which they have been vulnerable to their negative emotions. These vulnerabilities will be juxtaposed with experiences of positive emotions or situations, and patients challenged to use positive emotions to combat negative ones. Special emphasis will be placed on coping with negative emotions in conflict situations, and patients will process healthy conflict resolution skills.  Therapeutic Goals: Patient will identify two positive emotions or experiences to reflect on in order to balance out negative emotions:  Patient will label two or more emotions that they find the most difficult to experience:  Patient will be able to demonstrate positive conflict resolution skills through discussion or role plays:   Summary of Patient Progress: Patient was an active participant in group.  Patient was attentive and supportive of others.  Patient followed along with group discussion and responses were appropriate to the subject matter.      Therapeutic Modalities:   Cognitive Behavioral Therapy Feelings Identification Dialectical Behavioral Therapy   Rozann Lesches, LCSW

## 2022-03-22 NOTE — Plan of Care (Signed)
D: Patient alert and oriented. Patient denies pain. Patient denies anxiety and depression. Patient denies SI/HI/AVH.  Patient was unable to swallow scheduled 1700 Protonix because of her difficulty swallowing oral pills. Patient made an attempt to take the tablet and threw up in the trash can unable to swallow the pill.  Patient states that she prefers liquid or injections for medication.  A: Scheduled medications administered to patient, per MD orders.  Support and encouragement provided to patient.  Q15 minute safety checks maintained.   R: Patient compliant with medication administration and treatment plan. No adverse drug reactions noted. Patient remains safe on the unit at this time. Problem: Education: Goal: Knowledge of General Education information will improve Description: Including pain rating scale, medication(s)/side effects and non-pharmacologic comfort measures Outcome: Progressing   Problem: Activity: Goal: Will verbalize the importance of balancing activity with adequate rest periods Outcome: Progressing   Problem: Education: Goal: Knowledge of the prescribed therapeutic regimen will improve Outcome: Progressing   Problem: Health Behavior/Discharge Planning: Goal: Compliance with prescribed medication regimen will improve Outcome: Progressing

## 2022-03-23 ENCOUNTER — Other Ambulatory Visit: Payer: Self-pay

## 2022-03-23 DIAGNOSIS — F250 Schizoaffective disorder, bipolar type: Secondary | ICD-10-CM | POA: Diagnosis not present

## 2022-03-23 MED ORDER — NICOTINE 14 MG/24HR TD PT24: 14.0000 mg | MEDICATED_PATCH | Freq: Every day | TRANSDERMAL | 1 refills | Status: DC

## 2022-03-23 MED ORDER — CEFDINIR 300 MG PO CAPS
300.0000 mg | ORAL_CAPSULE | Freq: Two times a day (BID) | ORAL | 0 refills | Status: AC
  Filled 2022-03-23: qty 6, 3d supply, fill #0

## 2022-03-23 MED ORDER — HALOPERIDOL DECANOATE 100 MG/ML IM SOLN: 50.0000 mg | INTRAMUSCULAR | 1 refills | Status: AC

## 2022-03-23 MED ORDER — NICOTINE 14 MG/24HR TD PT24
14.0000 mg | MEDICATED_PATCH | Freq: Every day | TRANSDERMAL | 0 refills | Status: AC
  Filled 2022-03-23: qty 14, 14d supply, fill #0

## 2022-03-23 MED ORDER — HALOPERIDOL 5 MG PO TABS
5.0000 mg | ORAL_TABLET | Freq: Every day | ORAL | 0 refills | Status: AC
  Filled 2022-03-23: qty 15, 15d supply, fill #0

## 2022-03-23 MED ORDER — HALOPERIDOL DECANOATE 100 MG/ML IM SOLN
50.0000 mg | INTRAMUSCULAR | Status: DC
  Administered 2022-03-23: 50 mg via INTRAMUSCULAR
  Filled 2022-03-23: qty 0.5

## 2022-03-23 MED ORDER — PANTOPRAZOLE SODIUM 40 MG PO TBEC
40.0000 mg | DELAYED_RELEASE_TABLET | Freq: Two times a day (BID) | ORAL | 0 refills | Status: AC
  Filled 2022-03-23: qty 30, 15d supply, fill #0

## 2022-03-23 MED ORDER — POTASSIUM CHLORIDE CRYS ER 20 MEQ PO TBCR
40.0000 meq | EXTENDED_RELEASE_TABLET | Freq: Every day | ORAL | 0 refills | Status: AC
  Filled 2022-03-23: qty 15, 7d supply, fill #0

## 2022-03-23 MED ORDER — POTASSIUM CHLORIDE CRYS ER 20 MEQ PO TBCR: 40.0000 meq | EXTENDED_RELEASE_TABLET | Freq: Every day | ORAL | 1 refills | Status: DC

## 2022-03-23 MED ORDER — PANTOPRAZOLE SODIUM 40 MG PO TBEC: 40.0000 mg | DELAYED_RELEASE_TABLET | Freq: Two times a day (BID) | ORAL | 1 refills | Status: DC

## 2022-03-23 NOTE — Progress Notes (Signed)
Patient took her medications this shift without much trouble. Endorsing anxiety this shift Patient came up to [place] stating " I think someone is using my bathroom" Patient was reassured of safety. Patient had episodes of waking up though the night appears Paranoid and suspicious.  Support and encouragement provided. Patient was reassured of safety. Q 15 minutes safety checks ongoing Patient remains safe. Marland Kitchen

## 2022-03-23 NOTE — BHH Group Notes (Signed)
[facility] Group Notes:  (Nursing/MHT/Case Management/Adjunct)  Date:  03/23/2022  Time:  9:14 PM  Type of Therapy:   Wrap up  Participation Level:  Minimal  Participation Quality:  Appropriate  Affect:  Appropriate  Cognitive:  Appropriate  Insight:  Good  Engagement in Group:  Engaged and goal is to get reminded to take medicine.  Modes of Intervention:  Support  Summary of Progress/Problems:  Rita Ellis 03/23/2022, 9:14 PM

## 2022-03-23 NOTE — Progress Notes (Signed)
[facility] MD Progress Note  03/23/2022 1:18 PM Rita Ellis  MRN:  [id number] Subjective: Follow-up this 37 year old woman with schizophrenia.  No new complaints no problems no evidence of loosening of associations delusions or hallucinations currently.  We still do not have the results of the HIV test or the hemoglobin A1c for some reason. Principal Problem: Psychosis ([name]) Diagnosis: Principal Problem:   Psychosis ([name]) Active Problems:   Schizoaffective disorder ([name])   Nicotine dependence   Urinary tract infection   Hypokalemia  Total Time spent with patient: 30 minutes  Past Psychiatric History: Past history of schizophrenia  Past Medical History: History reviewed. No pertinent past medical history.  Past Surgical History:  Procedure Laterality Date   CESAREAN SECTION     Family History: History reviewed. No pertinent family history. Family Psychiatric  History: See previous Social History:  Social History   Substance and Sexual Activity  Alcohol Use Not Currently     Social History   Substance and Sexual Activity  Drug Use Never    Social History   Socioeconomic History   Marital status: Single    Spouse name: Not on file   Number of children: Not on file   Years of education: Not on file   Highest education level: Not on file  Occupational History   Not on file  Tobacco Use   Smoking status: Every Day   Smokeless tobacco: Never  Vaping Use   Vaping Use: Never used  Substance and Sexual Activity   Alcohol use: Not Currently   Drug use: Never   Sexual activity: Not on file  Other Topics Concern   Not on file  Social History Narrative   Not on file   Social Determinants of Health   Financial Resource Strain: Not on file  Food Insecurity: Patient Declined (03/21/2022)   Hunger Vital Sign    Worried About Running Out of Food in the Last Year: Patient declined    [place] in the Last Year: Patient declined  Transportation Needs: No Transportation  Needs (03/21/2022)   PRAPARE - Hydrologist (Medical): No    Lack of Transportation (Non-Medical): No  Physical Activity: Not on file  Stress: Not on file  Social Connections: Not on file   Additional Social History:                         Sleep: Fair  Appetite:  Fair  Current Medications: Current Facility-Administered Medications  Medication Dose Route Frequency Provider Last Rate Last Admin   acetaminophen (TYLENOL) tablet 650 mg  650 mg Oral Q6H PRN Sherlon Handing, NP       alum & mag hydroxide-simeth (MAALOX/MYLANTA) 200-200-20 MG/5ML suspension 30 mL  30 mL Oral Q4H PRN Waldon Merl F, NP   30 mL at 03/23/22 0914   cefdinir (OMNICEF) capsule 300 mg  300 mg Oral Q12H Waldon Merl F, NP   300 mg at 03/23/22 [id number]   haloperidol (HALDOL) tablet 5 mg  5 mg Oral QHS Sonam Wandel T, MD   5 mg at 03/22/22 2053   haloperidol decanoate (HALDOL DECANOATE) 100 MG/ML injection 50 mg  50 mg Intramuscular Q30 days Miking Usrey T, MD       magnesium hydroxide (MILK OF MAGNESIA) suspension 30 mL  30 mL Oral Daily PRN Waldon Merl F, NP       nicotine (NICODERM CQ - dosed in mg/24 hours) patch  14 mg  14 mg Transdermal Daily Maxfield Gildersleeve, [name], MD   14 mg at 03/23/22 0924   pantoprazole (PROTONIX) EC tablet 40 mg  40 mg Oral BID Arlow Spiers T, MD       potassium chloride SA (KLOR-CON M) CR tablet 40 mEq  40 mEq Oral Daily Sherlon Handing, NP   40 mEq at 03/23/22 [id number]    Lab Results:  Results for orders placed or performed during the hospital encounter of 03/21/22 (from the past 48 hour(s))  Lipid panel     Status: Abnormal   Collection Time: 03/22/22  6:38 AM  Result Value Ref Range   Cholesterol 160 0 - 200 mg/dL   Triglycerides 73 <150 mg/dL   HDL 31 (L) >40 mg/dL   Total CHOL/HDL Ratio 5.2 RATIO   VLDL 15 0 - 40 mg/dL   LDL Cholesterol 114 (H) 0 - 99 mg/dL    Comment:        Total Cholesterol/HDL:CHD Risk Coronary Heart Disease  Risk Table                     Men   Women  1/2 Average Risk   3.4   3.3  Average Risk       5.0   4.4  2 X Average Risk   9.6   7.1  3 X Average Risk  23.4   11.0        Use the calculated Patient Ratio above and the CHD Risk Table to determine the patient's CHD Risk.        ATP III CLASSIFICATION (LDL):  <100     mg/dL   Optimal  100-129  mg/dL   Near or Above                    Optimal  130-159  mg/dL   Borderline  160-189  mg/dL   High  >190     mg/dL   Very High Performed at [facility], [address] [place], [place] [address]     Blood Alcohol level:  Lab Results  Component Value Date   [facility] <10 03/18/2022   ETH <10 [id number]    Metabolic Disorder Labs: Lab Results  Component Value Date   HGBA1C 5.8 (H) 12/14/2018   MPG 119.76 12/14/2018   No results found for: "PROLACTIN" Lab Results  Component Value Date   CHOL 160 03/22/2022   TRIG 73 03/22/2022   HDL 31 (L) 03/22/2022   CHOLHDL 5.2 03/22/2022   VLDL 15 03/22/2022   LDLCALC 114 (H) 03/22/2022    Physical Findings: AIMS:  , ,  ,  ,    CIWA:    COWS:     Musculoskeletal: Strength & Muscle Tone: within normal limits Gait & Station: normal Patient leans: N/A  Psychiatric Specialty Exam:  Presentation  General Appearance:  Fairly Groomed  Eye Contact: Absent  Speech: Clear and Coherent  Speech Volume: Normal  Handedness:No data recorded  Mood and Affect  Mood: Dysphoric  Affect: Blunt; Flat (odd)   Thought Process  Thought Processes: Disorganized  Descriptions of Associations:Intact  Orientation:Full (Time, Place and Person)  Thought Content:Illogical  History of Schizophrenia/Schizoaffective disorder:No  Duration of Psychotic Symptoms:Less than six months  Hallucinations:No data recorded Ideas of Reference:Delusions; Percusatory  Suicidal Thoughts:No data recorded Homicidal Thoughts:No data recorded  Sensorium  Memory: Immediate  Fair  Judgment: Fair  Insight: [place]   Materials engineer: Fair  Attention Span: Fair  Recall: [employer] of Knowledge: Fair  Language: Fair   Psychomotor Activity  Psychomotor Activity:No data recorded  Assets  Assets: Communication Skills; Desire for Improvement; Housing; Social Support; Resilience; Physical Health   Sleep  Sleep:No data recorded   Physical Exam: Physical Exam Vitals and nursing note reviewed.  Constitutional:      Appearance: Normal appearance.  HENT:     Head: Normocephalic and atraumatic.     Mouth/Throat:     Pharynx: Oropharynx is clear.  Eyes:     Pupils: Pupils are equal, round, and reactive to light.  Cardiovascular:     Rate and Rhythm: Normal rate and regular rhythm.  Pulmonary:     Effort: Pulmonary effort is normal.     Breath sounds: Normal breath sounds.  Abdominal:     General: Abdomen is flat.     Palpations: Abdomen is soft.  Musculoskeletal:        General: Normal range of motion.  Skin:    General: Skin is warm and dry.  Neurological:     General: No focal deficit present.     Mental Status: She is alert. Mental status is at baseline.  Psychiatric:        Attention and Perception: Attention normal.        Mood and Affect: Mood normal.        Speech: Speech normal.        Behavior: Behavior normal.        Thought Content: Thought content normal.        Cognition and Memory: Cognition normal.    Review of Systems  Constitutional: Negative.   HENT: Negative.    Eyes: Negative.   Respiratory: Negative.    Cardiovascular: Negative.   Gastrointestinal: Negative.   Musculoskeletal: Negative.   Skin: Negative.   Neurological: Negative.   Psychiatric/Behavioral: Negative.     Blood pressure (!) 139/91, pulse 87, temperature 98.3 F (36.8 C), resp. rate 16, height 5' (1.524 m), weight 90.7 kg, SpO2 100 %. Body mass index is 39.06 kg/m.   Treatment Plan Summary: Medication management  and Plan 50 mg haloperidol decanoate shot today.  No other change to medicine.  Likely discharge tomorrow.  Alethia Berthold, MD 03/23/2022, 1:18 PM

## 2022-03-23 NOTE — Plan of Care (Signed)
  Problem: Education: Goal: Knowledge of General Education information will improve Description: Including pain rating scale, medication(s)/side effects and non-pharmacologic comfort measures Outcome: Progressing   Problem: Health Behavior/Discharge Planning: Goal: Ability to manage health-related needs will improve Outcome: Progressing   Problem: Clinical Measurements: Goal: Ability to maintain clinical measurements within normal limits will improve Outcome: Progressing Goal: Will remain free from infection Outcome: Progressing Goal: Diagnostic test results will improve Outcome: Progressing Goal: Respiratory complications will improve Outcome: Progressing Goal: Cardiovascular complication will be avoided Outcome: Progressing   Problem: Activity: Goal: Risk for activity intolerance will decrease Outcome: Progressing   Problem: Nutrition: Goal: Adequate nutrition will be maintained Outcome: Progressing   Problem: Coping: Goal: Level of anxiety will decrease Outcome: Progressing   Problem: Elimination: Goal: Will not experience complications related to bowel motility Outcome: Progressing Goal: Will not experience complications related to urinary retention Outcome: Progressing   Problem: Pain Managment: Goal: General experience of comfort will improve Outcome: Progressing   Problem: Safety: Goal: Ability to remain free from injury will improve Outcome: Progressing   Problem: Skin Integrity: Goal: Risk for impaired skin integrity will decrease Outcome: Progressing   Problem: Activity: Goal: Will verbalize the importance of balancing activity with adequate rest periods Outcome: Progressing   Problem: Education: Goal: Will be free of psychotic symptoms Outcome: Progressing Goal: Knowledge of the prescribed therapeutic regimen will improve Outcome: Progressing   Problem: Coping: Goal: Coping ability will improve Outcome: Progressing Goal: Will verbalize  feelings Outcome: Progressing   Problem: Health Behavior/Discharge Planning: Goal: Compliance with prescribed medication regimen will improve Outcome: Progressing   Problem: Nutritional: Goal: Ability to achieve adequate nutritional intake will improve Outcome: Progressing   Problem: Role Relationship: Goal: Ability to communicate needs accurately will improve Outcome: Progressing Goal: Ability to interact with others will improve Outcome: Progressing   Problem: Safety: Goal: Ability to redirect hostility and anger into socially appropriate behaviors will improve Outcome: Progressing Goal: Ability to remain free from injury will improve Outcome: Progressing   Problem: Self-Care: Goal: Ability to participate in self-care as condition permits will improve Outcome: Progressing   Problem: Self-Concept: Goal: Will verbalize positive feelings about self Outcome: Progressing   Problem: Education: Goal: Knowledge of [place] Education information/materials will improve Outcome: Progressing Goal: Emotional status will improve Outcome: Progressing Goal: Mental status will improve Outcome: Progressing Goal: Verbalization of understanding the information provided will improve Outcome: Progressing   Problem: Activity: Goal: Interest or engagement in activities will improve Outcome: Progressing Goal: Sleeping patterns will improve Outcome: Progressing   Problem: Coping: Goal: Ability to verbalize frustrations and anger appropriately will improve Outcome: Progressing Goal: Ability to demonstrate self-control will improve Outcome: Progressing   Problem: Health Behavior/Discharge Planning: Goal: Identification of resources available to assist in meeting health care needs will improve Outcome: Progressing Goal: Compliance with treatment plan for underlying cause of condition will improve Outcome: Progressing   Problem: Physical Regulation: Goal: Ability to  maintain clinical measurements within normal limits will improve Outcome: Progressing   Problem: Safety: Goal: Periods of time without injury will increase Outcome: Progressing   

## 2022-03-23 NOTE — Progress Notes (Signed)
D- Patient alert and oriented x 4. Affect anxious/mood preoccupied. Denies SI/ HI. States she hears voices but "doesn't know what they say". Her goal today is to "remember to take my meds". She endorses depression and anxiety. A- Scheduled medications administered to patient, per MD orders. She requests med's to be crushed and in applesauce. Haldol 50 mg administered IM left gluteal area, new order from MD. Support and encouragement provided.  Routine safety checks conducted every 15 minutes.  Patient informed to notify staff with problems or concerns and verbalizes understanding. R- No adverse drug reactions noted. Tolerated injection well. Patient compliant with medications and treatment plan. Patient receptive, calm, and cooperative. She interacts well with others on the unit and spent her day in her room except for meals. She attended outside activity. Patient contracts for safety and remains safe on the unit at this at this time.

## 2022-03-24 ENCOUNTER — Encounter: Payer: Self-pay | Admitting: Psychiatry

## 2022-03-24 ENCOUNTER — Other Ambulatory Visit: Payer: Self-pay

## 2022-03-24 DIAGNOSIS — F250 Schizoaffective disorder, bipolar type: Secondary | ICD-10-CM | POA: Diagnosis not present

## 2022-03-24 LAB — BASIC METABOLIC PANEL
Anion gap: 12 (ref 5–15)
BUN: 10 mg/dL (ref 6–20)
CO2: 24 mmol/L (ref 22–32)
Calcium: 9.2 mg/dL (ref 8.9–10.3)
Chloride: 98 mmol/L (ref 98–111)
Creatinine, Ser: 0.87 mg/dL (ref 0.44–1.00)
GFR, Estimated: >60 mL/min (ref >60)
Glucose, Bld: 119 mg/dL — ABNORMAL HIGH (ref 70–99)
Potassium: 3.7 mmol/L (ref 3.5–5.1)
Sodium: 134 mmol/L — ABNORMAL LOW (ref 135–145)

## 2022-03-24 LAB — HIV-1 RNA QUANT-NO REFLEX-BLD
HIV 1 RNA Quant: <20 copies/mL
LOG10 HIV-1 RNA: UNDETERMINED log10copy/mL

## 2022-03-24 LAB — HEMOGLOBIN A1C
Hgb A1c MFr Bld: 5.7 % — ABNORMAL HIGH (ref 4.8–5.6)
Mean Plasma Glucose: 117 mg/dL

## 2022-03-24 NOTE — Group Note (Signed)
Recreation Therapy Group Note   Group Topic:Goal Setting  Group Date: 03/24/2022 Start Time: 1000 End Time: 1045 Facilitators: Vilma Prader, LRT, CTRS Location:  Craft Room  Group Description: Scientist, physiological. Patients were given many different magazines, a glue stick, markers, and a piece of cardstock paper. LRT and pts discussed the importance of having goals in life. LRT and pts discussed the difference between short-term and long-term goals. LRT encouraged pts to create a vision board, with images they picked and then cut out by LRT from the magazine, for themselves, that capture their short and long-term goals. On the back of the paper, pt encouraged to write 3 different coping skills that can help them reach those goals. LRT encouraged pts to show and explain their vision board to the group once complete. LRT offered to laminate vision board once dry and complete.   Affect/Mood: Appropriate, Congruent, and Happy   Participation Level: Active and Engaged   Participation Quality: Independent   Behavior: Alert, Appropriate, Calm, and Cooperative   Speech/Thought Process: Concrete   Insight: Good and Improved   Judgement: Good   Modes of Intervention: Activity and Guided Discussion   Patient Response to Interventions:  Attentive, Engaged, Interested , and Receptive   Education Outcome:  Acknowledges education   Clinical Observations/Individualized Feedback: Rita Ellis was active in their participation of session activities and group discussion. Pt identified "pay for my kids college, pay off the family car, and raise a good family" as both her short and long term goals. Pt identified appropriate pictures for the goals and interacted well with LRT and peers duration of session.    Plan: Continue to engage patient in RT group sessions 2-3x/week.   Vilma Prader, LRT, CTRS 03/24/2022 11:19 AM

## 2022-03-24 NOTE — BHH Suicide Risk Assessment (Addendum)
[facility] Discharge Suicide Risk Assessment   Principal Problem: Psychosis [facility]) Discharge Diagnoses: Principal Problem:   Psychosis ([place]) Active Problems:   Schizoaffective disorder ([place])   Nicotine dependence   Urinary tract infection   Hypokalemia   Total Time spent with patient: 30 minutes  Musculoskeletal: Strength & Muscle Tone: within normal limits Gait & Station: normal Patient leans: N/A  Psychiatric Specialty Exam  Presentation  General Appearance:  Fairly Groomed  Eye Contact: Absent  Speech: Clear and Coherent  Speech Volume: Normal  Handedness:No data recorded  Mood and Affect  Mood: Dysphoric  Duration of Depression Symptoms: Greater than two weeks  Affect: Blunt; Flat (odd)   Thought Process  Thought Processes: Disorganized  Descriptions of Associations:Intact  Orientation:Full (Time, Place and Person)  Thought Content:Illogical  History of Schizophrenia/Schizoaffective disorder:No  Duration of Psychotic Symptoms:Less than six months  Hallucinations:No data recorded Ideas of Reference:Delusions; Percusatory  Suicidal Thoughts:No data recorded Homicidal Thoughts:No data recorded  Sensorium  Memory: Immediate Fair  Judgment: Fair  Insight: Fair   Materials engineer: Fair  Attention Span: Fair  Recall: [employer] of Knowledge: Fair  Language: Fair   Psychomotor Activity  Psychomotor Activity:No data recorded  Assets  Assets: Communication Skills; Desire for Improvement; Housing; Social Support; Resilience; Physical Health   Sleep  Sleep:No data recorded  Physical Exam: Physical Exam Vitals and nursing note reviewed.  Constitutional:      Appearance: Normal appearance.  HENT:     Head: Normocephalic and atraumatic.     Mouth/Throat:     Pharynx: Oropharynx is clear.  Eyes:     Pupils: Pupils are equal, round, and reactive to light.  Cardiovascular:     Rate and Rhythm: Normal  rate and regular rhythm.  Pulmonary:     Effort: Pulmonary effort is normal.     Breath sounds: Normal breath sounds.  Abdominal:     General: Abdomen is flat.     Palpations: Abdomen is soft.  Musculoskeletal:        General: Normal range of motion.  Skin:    General: Skin is warm and dry.  Neurological:     General: No focal deficit present.     Mental Status: She is alert. Mental status is at baseline.  Psychiatric:        Attention and Perception: Attention normal.        Mood and Affect: Mood normal.        Speech: Speech normal.        Behavior: Behavior normal.        Thought Content: Thought content normal.        Cognition and Memory: Cognition normal.        Judgment: Judgment normal.   Review of Systems  Constitutional: Negative.   HENT: Negative.    Eyes: Negative.   Respiratory: Negative.    Cardiovascular: Negative.   Gastrointestinal: Negative.   Musculoskeletal: Negative.   Skin: Negative.   Neurological: Negative.   Psychiatric/Behavioral: Negative.     Blood pressure (!) 131/95, pulse (!) 102, temperature 98.8 F (37.1 C), temperature source Oral, resp. rate 18, height 5' (1.524 m), weight 90.7 kg, SpO2 95 %. Body mass index is 39.06 kg/m.  Mental Status Per Nursing Assessment::   On Admission:  NA  Demographic Factors:  Living alone  Loss Factors: NA  Historical Factors: NA  Risk Reduction Factors:   Positive social support  Continued Clinical Symptoms:  Bipolar Disorder:   Mixed State  Schizophrenia:   Paranoid or undifferentiated type  Cognitive Features That Contribute To Risk:  None    Suicide Risk:  Minimal: No identifiable suicidal ideation.  Patients presenting with no risk factors but with morbid ruminations; may be classified as minimal risk based on the severity of the depressive symptoms   Follow-up Information     [facility] Follow up.   Why: Appointment is scheduled for 7:15AM for 03/26/2022.  Appointment  is with Peer Support Specialist Lanae Boast. Contact information: [place] [address] [phone]                 Plan Of Care/Follow-up recommendations:  Other:  Patient did not display any dangerous behavior in the hospital.  No aggression no talk of suicide.  Cooperative with medication.  Pleasant affect and agreeable to appropriate outpatient treatment.  Will be referred for follow-up at [facility].  Alethia Berthold, MD 03/24/2022, 10:18 AM

## 2022-03-24 NOTE — Progress Notes (Signed)
  [facility] Adult Case Management Discharge Plan :  Will you be returning to the same living situation after discharge:  Yes,  Patient to discharge to place of residence.  At discharge, do you have transportation home?: Yes,  Patient's mother to provide transportation from hospital.  Do you have the ability to pay for your medications: No.  Patient has no listed insurance, to be provided with 7 day supply of medication at discharge. CSW has provided patient with clinic information for free/reduced medications.   Release of information consent forms completed and in the chart;  Patient's signature needed at discharge.  Patient to Follow up at:  Follow-up Information     [place] Follow up.   Why: Appointment is scheduled for 7:15AM for 03/26/2022.  Appointment is with Peer Support Specialist Lanae Boast. Contact information: [place] [address] [phone]                 Next level of care provider has access to [place] and Suicide Prevention discussed: Yes,  SPE completed with patient by nursing staff, unable to reach [facility], mother, [phone] on two attempts .    Has patient been referred to the Quitline?: Yes, faxed on 03/24/2022 Tobacco Use: High Risk (03/24/2022)   Patient History    Smoking Tobacco Use: Every Day    Smokeless Tobacco Use: Never    Passive Exposure: Not on file   Patient has been referred for addiction treatment: N/A Patient denies active substance use, UDS Negative for all, screened low risk during nursing admission (see [facility] quick tab).  Social History   Substance and Sexual Activity  Drug Use Never   Social History   Substance and Sexual Activity  Alcohol Use Not Currently   Larose Kells 03/24/2022, 10:03 AM

## 2022-03-24 NOTE — Discharge Summary (Signed)
Physician Discharge Summary Note  Patient:  Rita Ellis is an 37 y.o., female MRN:  [id number] DOB:  [birth year] Patient phone:  [phone] (home)  Patient address:   [place] [address],  Total Time spent with patient: 30 minutes  Date of Admission:  03/21/2022 Date of Discharge: 03/24/2022  Reason for Admission: Patient was admitted to the hospital because of presentation with return of psychotic symptoms and disorganized behavior  Principal Problem: Psychosis [facility]) Discharge Diagnoses: Principal Problem:   Psychosis ([place]) Active Problems:   Schizoaffective disorder ([place])   Nicotine dependence   Urinary tract infection   Hypokalemia   Past Psychiatric History: History of schizoaffective disorder with recurrent episodes of psychosis with anxiety and odd behavior.  Past Medical History: History reviewed. No pertinent past medical history.  Past Surgical History:  Procedure Laterality Date   CESAREAN SECTION     Family History: History reviewed. No pertinent family history. Family Psychiatric  History: See previous Social History:  Social History   Substance and Sexual Activity  Alcohol Use Not Currently     Social History   Substance and Sexual Activity  Drug Use Never    Social History   Socioeconomic History   Marital status: Single    Spouse name: Not on file   Number of children: Not on file   Years of education: Not on file   Highest education level: Not on file  Occupational History   Not on file  Tobacco Use   Smoking status: Every Day   Smokeless tobacco: Never  Vaping Use   Vaping Use: Never used  Substance and Sexual Activity   Alcohol use: Not Currently   Drug use: Never   Sexual activity: Not on file  Other Topics Concern   Not on file  Social History Narrative   Not on file   Social Determinants of Health   Financial Resource Strain: Not on file  Food Insecurity: Patient Declined (03/21/2022)   Hunger Vital Sign     Worried About Running Out of Food in the Last Year: Patient declined    [place] in the Last Year: Patient declined  Transportation Needs: No Transportation Needs (03/21/2022)   PRAPARE - Hydrologist (Medical): No    Lack of Transportation (Non-Medical): No  Physical Activity: Not on file  Stress: Not on file  Social Connections: Not on file    Hospital Course: Patient was engaged in individual and group therapy and attended treatment team.  Restarted on medication.  Received haloperidol decanoate shot over the weekend.  Mental status much improved.  Lucid and clear in her thinking.  No report of any hallucinations or suicidal ideation.  Patient will be discharged on current medication with follow-up recommended at [facility].  Physical Findings: AIMS:  , ,  ,  ,    CIWA:    COWS:     Musculoskeletal: Strength & Muscle Tone: within normal limits Gait & Station: normal Patient leans: N/A   Psychiatric Specialty Exam:  Presentation  General Appearance:  Fairly Groomed  Eye Contact: Absent  Speech: Clear and Coherent  Speech Volume: Normal  Handedness:No data recorded  Mood and Affect  Mood: Dysphoric  Affect: Blunt; Flat (odd)   Thought Process  Thought Processes: Disorganized  Descriptions of Associations:Intact  Orientation:Full (Time, Place and Person)  Thought Content:Illogical  History of Schizophrenia/Schizoaffective disorder:No  Duration of Psychotic Symptoms:Less than six months  Hallucinations:No data recorded Ideas of  Reference:Delusions; Percusatory  Suicidal Thoughts:No data recorded Homicidal Thoughts:No data recorded  Sensorium  Memory: Immediate Fair  Judgment: Fair  Insight: Fair   Materials engineer: Fair  Attention Span: Fair  Recall: [employer] of Knowledge: Fair  Language: Fair   Psychomotor Activity  Psychomotor Activity:No data recorded  Assets   Assets: Communication Skills; Desire for Improvement; Housing; Social Support; Resilience; Physical Health   Sleep  Sleep:No data recorded   Physical Exam: Physical Exam Vitals and nursing note reviewed.  Constitutional:      Appearance: Normal appearance.  HENT:     Head: Normocephalic and atraumatic.     Mouth/Throat:     Pharynx: Oropharynx is clear.  Eyes:     Pupils: Pupils are equal, round, and reactive to light.  Cardiovascular:     Rate and Rhythm: Normal rate and regular rhythm.  Pulmonary:     Effort: Pulmonary effort is normal.     Breath sounds: Normal breath sounds.  Abdominal:     General: Abdomen is flat.     Palpations: Abdomen is soft.  Musculoskeletal:        General: Normal range of motion.  Skin:    General: Skin is warm and dry.  Neurological:     General: No focal deficit present.     Mental Status: She is alert. Mental status is at baseline.  Psychiatric:        Attention and Perception: Attention normal.        Mood and Affect: Mood normal.        Speech: Speech normal.        Behavior: Behavior normal.        Thought Content: Thought content normal.        Cognition and Memory: Cognition normal.        Judgment: Judgment normal.    Review of Systems  Constitutional: Negative.   HENT: Negative.    Eyes: Negative.   Respiratory: Negative.    Cardiovascular: Negative.   Gastrointestinal: Negative.   Musculoskeletal: Negative.   Skin: Negative.   Neurological: Negative.   Psychiatric/Behavioral: Negative.     Blood pressure (!) 131/95, pulse (!) 102, temperature 98.8 F (37.1 C), temperature source Oral, resp. rate 18, height 5' (1.524 m), weight 90.7 kg, SpO2 95 %. Body mass index is 39.06 kg/m.   Social History   Tobacco Use  Smoking Status Every Day  Smokeless Tobacco Never   Tobacco Cessation:  A prescription for an FDA-approved tobacco cessation medication provided at discharge   Blood Alcohol level:  Lab Results   Component Value Date   [facility] <10 03/18/2022   ETH <10 [id number]    Metabolic Disorder Labs:  Lab Results  Component Value Date   HGBA1C 5.8 (H) 12/14/2018   MPG 119.76 12/14/2018   No results found for: "PROLACTIN" Lab Results  Component Value Date   CHOL 160 03/22/2022   TRIG 73 03/22/2022   HDL 31 (L) 03/22/2022   CHOLHDL 5.2 03/22/2022   VLDL 15 03/22/2022   LDLCALC 114 (H) 03/22/2022    See Psychiatric Specialty Exam and Suicide Risk Assessment completed by Attending Physician prior to discharge.  Discharge destination:  Home  Is patient on multiple antipsychotic therapies at discharge:  No   Has Patient had three or more failed trials of antipsychotic monotherapy by history:  No  Recommended Plan for Multiple Antipsychotic Therapies: NA  Discharge Instructions     Diet - low sodium heart healthy  Complete by: As directed    Increase activity slowly   Complete by: As directed       Allergies as of 03/24/2022   No Known Allergies      Medication List     STOP taking these medications    famotidine 20 MG tablet Commonly known as: PEPCID   hydrOXYzine 50 MG tablet Commonly known as: ATARAX   ibuprofen 800 MG tablet Commonly known as: ADVIL   methocarbamol 500 MG tablet Commonly known as: Robaxin   OLANZapine zydis 10 MG disintegrating tablet Commonly known as: ZYPREXA       TAKE these medications      Indication  cefdinir 300 MG capsule Commonly known as: OMNICEF Take 1 capsule (300 mg total) by mouth every 12 (twelve) hours.  Indication: Simple Infection of the Urinary Tract   haloperidol 5 MG tablet Commonly known as: HALDOL Take 1 tablet (5 mg total) by mouth at bedtime.  Indication: Psychosis   haloperidol decanoate 100 MG/ML injection Commonly known as: HALDOL DECANOATE Inject 0.5 mLs (50 mg total) into the muscle every 30 (thirty) days. Start taking on: April 20, 2022  Indication: Schizophrenia   nicotine 14 mg/24hr  patch Commonly known as: NICODERM CQ - dosed in mg/24 hours Place 1 patch (14 mg total) onto the skin daily.  Indication: Nicotine Addiction   pantoprazole 40 MG tablet Commonly known as: PROTONIX Take 1 tablet (40 mg total) by mouth 2 (two) times daily.  Indication: Gastroesophageal Reflux Disease   potassium chloride SA 20 MEQ tablet Commonly known as: KLOR-CON M Take 2 tablets (40 mEq total) by mouth daily.  Indication: Low Amount of Potassium in the Blood        Follow-up Information     [place] Follow up.   Why: Appointment is scheduled for 7:15AM for 03/26/2022.  Appointment is with Peer Support Specialist Lanae Boast. Contact information: [place] [address] [phone]                 Follow-up recommendations:  Other:  Follow-up with [facility] continue current medication  Comments: See above  Signed: Alethia Berthold, MD 03/24/2022, 10:43 AM

## 2022-03-24 NOTE — Progress Notes (Signed)
D- Patient alert and oriented. Patient presents in a preoccupied, but pleasant mood on assessment stating that she slept "really good", last night and had no complaints to voice to this Probation officer. Although patient denies any signs/symptoms of depression/anxiety, she did endorse them both on her self-inventory. Patient did express some anxiety last night, after being given some medication in the middle of the night. Patient denies SI, HI, AVH, and pain at this time. Patient's goal for today, per her self-inventory, is "taking all meds and do any cleaning up in my room today". Patient also wanted staff to know that she is thankful for our time and patience, "and it was a pleasure of y'all helping me out".  A- Scheduled medications administered to patient, per MD orders. Support and encouragement provided.  Routine safety checks conducted every 15 minutes. Patient informed to notify staff with problems or concerns.  R- No adverse drug reactions noted. Patient contracts for safety at this time. Patient compliant with medications and treatment plan. Patient receptive, calm, and cooperative. Patient interacts well with others on the unit. Patient remains safe at this time.

## 2022-03-24 NOTE — BHH Group Notes (Signed)
[place] Group Notes:  (Nursing/MHT/Case Management/Adjunct)  Date:  03/24/2022  Time:  10:01 AM  Type of Therapy:   Community Meeting  Participation Level:  Active  Participation Quality:  Appropriate  Affect:  Appropriate  Cognitive:  Appropriate  Insight:  Appropriate  Engagement in Group:  Engaged  Modes of Intervention:  Discussion, Education, and Support  Summary of Progress/Problems:  Adela Lank Karanveer Ramakrishnan 03/24/2022, 10:01 AM

## 2022-03-24 NOTE — BHH Suicide Risk Assessment (Signed)
[place] INPATIENT:  Family/Significant Other Suicide Prevention Education  Suicide Prevention Education:  Contact Attempts: Brionna Brugh, mother, [phone]  has been identified by the patient as the family member/significant other with whom the patient will be residing, and identified as the person(s) who will aid the patient in the event of a mental health crisis.  With written consent from the patient, two attempts were made to provide suicide prevention education, prior to and/or following the patient's discharge.  We were unsuccessful in providing suicide prevention education.  A suicide education pamphlet was given to the patient to share with family/significant other.  Date and time of first attempt: 03/22/2022 at 11:18AM  Date and time of second attempt: 03/24/2022 10:06 AM  Durenda Hurt 03/24/2022, 10:06 AM

## 2022-03-24 NOTE — Progress Notes (Signed)
Patient is pleasant and cooperative.  She is clear and logical in he thoughts and speech.  She is med compliant. She had questions about her Haldol 5mg  tabs since she is taking the injection, but she is took meds without issue.  She denies  si hi avh. No issues or concerns at this encounter. Reports looking forward to going home tomorrow.    C Butler-Nicholson, LPN

## 2022-03-24 NOTE — Plan of Care (Signed)
Problem: Education: Goal: Knowledge of General Education information will improve Description: Including pain rating scale, medication(s)/side effects and non-pharmacologic comfort measures Outcome: Adequate for Discharge   Problem: Health Behavior/Discharge Planning: Goal: Ability to manage health-related needs will improve Outcome: Adequate for Discharge   Problem: Clinical Measurements: Goal: Ability to maintain clinical measurements within normal limits will improve Outcome: Adequate for Discharge Goal: Will remain free from infection Outcome: Adequate for Discharge Goal: Diagnostic test results will improve Outcome: Adequate for Discharge Goal: Respiratory complications will improve Outcome: Adequate for Discharge Goal: Cardiovascular complication will be avoided Outcome: Adequate for Discharge   Problem: Activity: Goal: Risk for activity intolerance will decrease Outcome: Adequate for Discharge   Problem: Nutrition: Goal: Adequate nutrition will be maintained Outcome: Adequate for Discharge   Problem: Coping: Goal: Level of anxiety will decrease Outcome: Adequate for Discharge   Problem: Elimination: Goal: Will not experience complications related to bowel motility Outcome: Adequate for Discharge Goal: Will not experience complications related to urinary retention Outcome: Adequate for Discharge   Problem: Pain Managment: Goal: General experience of comfort will improve Outcome: Adequate for Discharge   Problem: Safety: Goal: Ability to remain free from injury will improve Outcome: Adequate for Discharge   Problem: Skin Integrity: Goal: Risk for impaired skin integrity will decrease Outcome: Adequate for Discharge   Problem: Activity: Goal: Will verbalize the importance of balancing activity with adequate rest periods Outcome: Adequate for Discharge   Problem: Education: Goal: Will be free of psychotic symptoms Outcome: Adequate for Discharge Goal:  Knowledge of the prescribed therapeutic regimen will improve Outcome: Adequate for Discharge   Problem: Coping: Goal: Coping ability will improve Outcome: Adequate for Discharge Goal: Will verbalize feelings Outcome: Adequate for Discharge   Problem: Health Behavior/Discharge Planning: Goal: Compliance with prescribed medication regimen will improve Outcome: Adequate for Discharge   Problem: Nutritional: Goal: Ability to achieve adequate nutritional intake will improve Outcome: Adequate for Discharge   Problem: Role Relationship: Goal: Ability to communicate needs accurately will improve Outcome: Adequate for Discharge Goal: Ability to interact with others will improve Outcome: Adequate for Discharge   Problem: Safety: Goal: Ability to redirect hostility and anger into socially appropriate behaviors will improve Outcome: Adequate for Discharge Goal: Ability to remain free from injury will improve Outcome: Adequate for Discharge   Problem: Self-Care: Goal: Ability to participate in self-care as condition permits will improve Outcome: Adequate for Discharge   Problem: Self-Concept: Goal: Will verbalize positive feelings about self Outcome: Adequate for Discharge   Problem: Education: Goal: Knowledge of [facility] General Education information/materials will improve Outcome: Adequate for Discharge Goal: Emotional status will improve Outcome: Adequate for Discharge Goal: Mental status will improve Outcome: Adequate for Discharge Goal: Verbalization of understanding the information provided will improve Outcome: Adequate for Discharge   Problem: Activity: Goal: Interest or engagement in activities will improve Outcome: Adequate for Discharge Goal: Sleeping patterns will improve Outcome: Adequate for Discharge   Problem: Coping: Goal: Ability to verbalize frustrations and anger appropriately will improve Outcome: Adequate for Discharge Goal: Ability to demonstrate  self-control will improve Outcome: Adequate for Discharge   Problem: Health Behavior/Discharge Planning: Goal: Identification of resources available to assist in meeting health care needs will improve Outcome: Adequate for Discharge Goal: Compliance with treatment plan for underlying cause of condition will improve Outcome: Adequate for Discharge   Problem: Physical Regulation: Goal: Ability to maintain clinical measurements within normal limits will improve Outcome: Adequate for Discharge   Problem: Safety: Goal: Periods of time without  injury will increase Outcome: Adequate for Discharge

## 2022-03-24 NOTE — Progress Notes (Signed)
Patient ID: Rita Ellis, female   DOB: 01-10-1985, 37 y.o.   MRN: [id number]  Discharge Note:  Patient denies SI/HI/AVH at this time. Discharge instructions, AVS, prescriptions, medication supply, and transition record gone over with patient. Patient given a copy of her Suicide Safety Plan. Patient agrees to comply with medication management, follow-up visit, and outpatient therapy. Patient belongings returned to patient. Patient questions and concerns addressed and answered. Patient ambulatory off unit. Patient discharged to home with Mother and brother.

## 2022-03-24 NOTE — Plan of Care (Signed)
  Problem: Education: Goal: Knowledge of General Education information will improve Description: Including pain rating scale, medication(s)/side effects and non-pharmacologic comfort measures Outcome: Progressing   Problem: Clinical Measurements: Goal: Ability to maintain clinical measurements within normal limits will improve Outcome: Progressing Goal: Will remain free from infection Outcome: Progressing Goal: Diagnostic test results will improve Outcome: Progressing Goal: Respiratory complications will improve Outcome: Progressing Goal: Cardiovascular complication will be avoided Outcome: Progressing   Problem: Activity: Goal: Risk for activity intolerance will decrease Outcome: Progressing   Problem: Nutrition: Goal: Adequate nutrition will be maintained Outcome: Progressing   Problem: Coping: Goal: Level of anxiety will decrease Outcome: Progressing   Problem: Self-Care: Goal: Ability to participate in self-care as condition permits will improve Outcome: Progressing   Problem: Safety: Goal: Ability to redirect hostility and anger into socially appropriate behaviors will improve Outcome: Progressing Goal: Ability to remain free from injury will improve Outcome: Progressing   Problem: Role Relationship: Goal: Ability to communicate needs accurately will improve Outcome: Progressing Goal: Ability to interact with others will improve Outcome: Progressing
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
## 2022-04-16 ENCOUNTER — Other Ambulatory Visit: Payer: Self-pay

## 2022-04-16 MED ORDER — HYDROXYZINE HCL 25 MG PO TABS
25.0000 mg | ORAL_TABLET | Freq: Every evening | ORAL | 1 refills | Status: AC | PRN
  Filled 2022-04-16: qty 60, 30d supply, fill #0

## 2022-04-16 MED ORDER — ARIPIPRAZOLE 10 MG PO TABS
ORAL_TABLET | ORAL | 1 refills | Status: AC
  Filled 2022-04-16: qty 30, 30d supply, fill #0

## 2022-05-07 ENCOUNTER — Other Ambulatory Visit: Payer: Self-pay

## 2022-05-08 ENCOUNTER — Other Ambulatory Visit: Payer: Self-pay

## 2022-07-17 ENCOUNTER — Other Ambulatory Visit: Payer: Self-pay

## 2022-07-17 MED ORDER — HYDROXYZINE HCL 25 MG PO TABS
25.0000 mg | ORAL_TABLET | Freq: Every evening | ORAL | 1 refills | Status: AC | PRN
  Filled 2022-07-17: qty 60, 30d supply, fill #0

## 2022-08-01 ENCOUNTER — Other Ambulatory Visit: Payer: Self-pay

## 2022-12-02 ENCOUNTER — Emergency Department
Admission: EM | Admit: 2022-12-02 | Discharge: 2022-12-02 | Disposition: A | Discharge status: Home / Self Care | Payer: Self-pay | Attending: Emergency Medicine | Admitting: Emergency Medicine

## 2022-12-02 ENCOUNTER — Other Ambulatory Visit: Payer: Self-pay

## 2022-12-02 DIAGNOSIS — W44F4XA Insect entering into or through a natural orifice, initial encounter: Secondary | ICD-10-CM | POA: Insufficient documentation

## 2022-12-02 DIAGNOSIS — T162XXA Foreign body in left ear, initial encounter: Secondary | ICD-10-CM | POA: Insufficient documentation

## 2022-12-02 MED ORDER — LIDOCAINE HCL (PF) 1 % IJ SOLN
5.0000 mL | Freq: Once | INTRAMUSCULAR | Status: AC
  Administered 2022-12-02: 5 mL
  Filled 2022-12-02: qty 5

## 2022-12-02 NOTE — ED Triage Notes (Signed)
Pt to ED via POV c/o bug in left ear. Pt can feel it moving around. Causing her pain.

## 2022-12-02 NOTE — Discharge Instructions (Addendum)
Fortunately the insect in your ear came out without much difficulty.  Please avoid putting anything in your ears for at least a couple of days.  If your ear is hurting you can take over-the-counter ibuprofen and or Tylenol as needed according to the label instructions.

## 2022-12-02 NOTE — ED Provider Notes (Signed)
[facility] Provider Note    Event Date/Time   First MD Initiated Contact with Patient 12/02/22 0330     (approximate)   History   Foreign Body in Ear   HPI Rita Ellis is a 37 y.o. female who presents for evaluation of an insect in her left ear.  She said she felt normal when she went to bed and then woke up around midnight and could hear and feel something crawling around inside her ear.  Occasionally there will be a sharp pain but generally it is just frustrating and irritating to her.  No loss of hearing, no other signs or symptoms.     Physical Exam   Triage Vital Signs: ED Triage Vitals  Encounter Vitals Group     BP 12/02/22 0148 122/87     Systolic BP Percentile --      Diastolic BP Percentile --      Pulse Rate 12/02/22 0148 78     Resp 12/02/22 0148 16     Temp 12/02/22 0148 98.2 F (36.8 C)     Temp Source 12/02/22 0148 Oral     SpO2 12/02/22 0148 96 %     Weight --      Height --      Head Circumference --      Peak Flow --      Pain Score 12/02/22 0147 8     Pain Loc --      Pain Education --      Exclude from Growth Chart --     Most recent vital signs: Vitals:   12/02/22 0148  BP: 122/87  Pulse: 78  Resp: 16  Temp: 98.2 F (36.8 C)  SpO2: 96%    General: Awake, no distress.  Ears:  Patient has a small insect easily visible within the left ear canal which she is walking around inside the canal.  Intact TM. CV:  Good peripheral perfusion.  Resp:  Normal effort. Speaking easily and comfortably, no accessory muscle usage nor intercostal retractions.   Abd:  No distention.  Other:  Calm and cooperative mood and affect.   ED Results / Procedures / Treatments   Labs (all labs ordered are listed, but only abnormal results are displayed) Labs Reviewed - No data to display     PROCEDURES:  Critical Care performed: No  .Foreign Body Removal  Date/Time: 12/02/2022 4:00 AM  Performed by: Loleta Rose,  MD Authorized by: Loleta Rose, MD  Consent: Verbal consent obtained. Risks and benefits: risks, benefits and alternatives were discussed Body area: ear Location details: left ear Localization method: visualized Removal mechanism: irrigation Complexity: simple 1 objects recovered. Objects recovered: insect Post-procedure assessment: foreign body removed Patient tolerance: patient tolerated the procedure well with no immediate complications      IMPRESSION / MDM / ASSESSMENT AND PLAN / ED COURSE  I reviewed the triage vital signs and the nursing notes.                              Differential diagnosis includes, but is not limited to, foreign body, insect, tympanic membrane perforation.  Patient's presentation is most consistent with acute, uncomplicated illness.  Interventions/Medications given:  Medications  lidocaine (PF) (XYLOCAINE) 1 % injection 5 mL (5 mLs Other Given 12/02/22 0442)    (Note:  hospital course my include additional interventions and/or labs/studies not listed above.)   I irrigated the patient's  ear with a 18-gauge catheter and 10 mL of normal saline.  Nothing came out at the time so I had her turn her head down with gravity while I gathered up additional materials.  By the time I went back in, the insect had come out and they captured it in a specimen jar.  It does not appear to be a bedbug nor scabies.  It also does not appear to be a roach.  One of the ED staff identified it as a small "chimney swift bug".  I reexamined the patient's ear after the insect came out and I see no other evidence of any residual foreign body and the tympanic membrane remains intact.  There is a small dot of fresh blood which I suspect occurred when the patient was initially scratching at her ear but no indication for antibiotics and no indication for additional irrigation or treatment.  Patient will follow-up as an outpatient.       FINAL CLINICAL IMPRESSION(S) / ED  DIAGNOSES   Final diagnoses:  Foreign body of left ear, initial encounter     Rx / DC Orders   ED Discharge Orders     None        Note:  This document was prepared using Dragon voice recognition software and may include unintentional dictation errors.   Loleta Rose, MD 12/02/22 [phone]

## 2023-02-19 ENCOUNTER — Other Ambulatory Visit: Payer: Self-pay

## 2023-02-19 MED ORDER — HYDROXYZINE HCL 25 MG PO TABS
25.0000 mg | ORAL_TABLET | ORAL | 2 refills | Status: AC | PRN
  Filled 2023-02-19: qty 60, 30d supply, fill #0

## 2023-03-04 ENCOUNTER — Other Ambulatory Visit: Payer: Self-pay

## 2023-04-22 ENCOUNTER — Other Ambulatory Visit: Payer: Self-pay

## 2023-04-22 MED ORDER — HYDROXYZINE HCL 25 MG PO TABS
50.0000 mg | ORAL_TABLET | Freq: Every evening | ORAL | 2 refills | Status: AC | PRN
  Filled 2023-04-22: qty 60, 30d supply, fill #0

## 2023-05-04 ENCOUNTER — Other Ambulatory Visit: Payer: Self-pay

## 2023-10-02 ENCOUNTER — Ambulatory Visit (LOCAL_COMMUNITY_HEALTH_CENTER): Payer: Self-pay

## 2023-10-02 DIAGNOSIS — Z111 Encounter for screening for respiratory tuberculosis: Secondary | ICD-10-CM

## 2023-10-02 NOTE — Progress Notes (Signed)
 Tuberculin skin test applied to left ventral forearm.  Explained not to use lotions or creams, place bandages or wraps on testing area.  Advised, if bleeding occurs, to lightly dab testing area and given 2x2 gauze in case of leakage or bleeding.  Kandi KATHEE Glatter, RN

## 2023-10-05 ENCOUNTER — Ambulatory Visit (LOCAL_COMMUNITY_HEALTH_CENTER): Payer: Self-pay

## 2023-10-05 DIAGNOSIS — Z111 Encounter for screening for respiratory tuberculosis: Secondary | ICD-10-CM

## 2023-10-05 LAB — TB SKIN TEST
Induration: 0 mm
TB Skin Test: NEGATIVE

## 2023-10-05 NOTE — Progress Notes (Signed)
 PPD read and results entered in [employer]. Result: 0 mm induration. Interpretation: Negative  Kandi KATHEE Glatter, RN
